# Patient Record
Sex: Female | Born: 1990 | Hispanic: No | Marital: Single | Smoking: Former smoker
Health system: Southern US, Community
[De-identification: ages and names within clinical notes are randomized; demographics above are authoritative.]

## PROBLEM LIST (undated history)

## (undated) ENCOUNTER — Inpatient Hospital Stay (HOSPITAL_COMMUNITY): Payer: Self-pay

## (undated) DIAGNOSIS — R519 Headache, unspecified: Secondary | ICD-10-CM

## (undated) DIAGNOSIS — F32A Depression, unspecified: Secondary | ICD-10-CM

## (undated) DIAGNOSIS — R51 Headache: Secondary | ICD-10-CM

## (undated) DIAGNOSIS — N920 Excessive and frequent menstruation with regular cycle: Secondary | ICD-10-CM

## (undated) DIAGNOSIS — F419 Anxiety disorder, unspecified: Secondary | ICD-10-CM

## (undated) DIAGNOSIS — N61 Mastitis without abscess: Secondary | ICD-10-CM

## (undated) DIAGNOSIS — F329 Major depressive disorder, single episode, unspecified: Secondary | ICD-10-CM

## (undated) DIAGNOSIS — Z8619 Personal history of other infectious and parasitic diseases: Secondary | ICD-10-CM

## (undated) DIAGNOSIS — Z91018 Allergy to other foods: Secondary | ICD-10-CM

## (undated) HISTORY — DX: Allergy to other foods: Z91.018

## (undated) HISTORY — DX: Anxiety disorder, unspecified: F41.9

## (undated) HISTORY — DX: Mastitis without abscess: N61.0

## (undated) HISTORY — DX: Depression, unspecified: F32.A

## (undated) HISTORY — DX: Personal history of other infectious and parasitic diseases: Z86.19

## (undated) HISTORY — DX: Major depressive disorder, single episode, unspecified: F32.9

---
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
## 2010-09-28 NOTE — L&D Delivery Note (Signed)
Delivery Note At 3:03 PM a viable female was delivered via Vaginal, Spontaneous Delivery ROA, easy delivery of the head, easy delivery of the shoulders, baby placed on pt abd, cord doubly clamped et cut by FOB.  APGAR: 8, 8; weight 6 lb 1.5 oz (2764 g).   Placenta schultze, Intact, Spontaneous.  Cord: 3 vessels.IV Pitocin 20 u.  Shallow 1 degree perineal laceration with repair 4-0 Vicryl. Epidural for labor and delivery. EBL 200 ml. Stable. Delivery by April Merritt, CNM  Mom to postpartum.  Baby to skin to skin.  Nigel Bridgeman 09/11/2011, 3:29 PM

## 2011-04-06 LAB — ABO/RH: RH Type: POSITIVE

## 2011-04-06 LAB — ANTIBODY SCREEN: Antibody Screen: NEGATIVE

## 2011-04-06 LAB — HEPATITIS B SURFACE ANTIGEN: Hepatitis B Surface Ag: NEGATIVE

## 2011-04-06 LAB — HIV ANTIBODY (ROUTINE TESTING W REFLEX): HIV: NONREACTIVE

## 2011-04-06 LAB — RUBELLA ANTIBODY, IGM: Rubella: IMMUNE

## 2011-09-04 ENCOUNTER — Encounter (HOSPITAL_COMMUNITY): Payer: Self-pay

## 2011-09-04 ENCOUNTER — Inpatient Hospital Stay (HOSPITAL_COMMUNITY)
Admission: AD | Admit: 2011-09-04 | Discharge: 2011-09-04 | Disposition: A | Discharge status: Home / Self Care | Payer: Medicaid Other | Source: Ambulatory Visit | Attending: Obstetrics and Gynecology | Admitting: Obstetrics and Gynecology

## 2011-09-04 DIAGNOSIS — O368390 Maternal care for abnormalities of the fetal heart rate or rhythm, unspecified trimester, not applicable or unspecified: Secondary | ICD-10-CM | POA: Insufficient documentation

## 2011-09-04 DIAGNOSIS — O4700 False labor before 37 completed weeks of gestation, unspecified trimester: Secondary | ICD-10-CM | POA: Insufficient documentation

## 2011-09-04 DIAGNOSIS — O479 False labor, unspecified: Secondary | ICD-10-CM

## 2011-09-04 HISTORY — DX: Headache: R51

## 2011-09-04 NOTE — Progress Notes (Signed)
Patient states she was sent from the office after having a nonreactive NST in the office. Patient reports some contractions and the baby is moving well. Denies any leaking or bleeding.

## 2011-09-04 NOTE — ED Provider Notes (Signed)
History   20 yo G1P0 at 51 5/7 weeks presenting from office for monitoring due to non-reactive FHR in office while being monitored for contractions.  Denies leaking or bleeding, reports +FM now.  Did note contractions on NST, some moderate, most mild.  Pregnancy remarkable for: Migraines LVEIF Hx FHR arrythmia, with normal echo, and resolution of arrythmia by 34 weeks   Chief Complaint  Patient presents with  . Non-stress Test     OB History    Grav Para Term Preterm Abortions TAB SAB Ect Mult Living   1 0 0 0 0 0 0 0 0 0       Past Medical History  Diagnosis Date  . Headache     hx of migraines    Past Surgical History  Procedure Date  . No past surgeries     Family History  Problem Relation Age of Onset  . Alcohol abuse Father   . Asthma Father   . Heart disease Father   . Cancer Maternal Grandfather     History  Substance Use Topics  . Smoking status: Never Smoker   . Smokeless tobacco: Not on file  . Alcohol Use: No    Allergies: Allergies not on file  No prescriptions prior to admission     Physical Exam   Blood pressure 128/74, pulse 97, temperature 97.8 F (36.6 C), temperature source Oral, resp. rate 20, height 5\' 2"  (1.575 m), weight 76.204 kg (168 lb), SpO2 99.00%.  Chest clear Heart RRR without murmur Abd gravid, NT FHR reactive, no decels, segment of negative spontaneous CST. Irregular UCs in timing and quality, q3-14min, most very mild. Cervix closed at office.  ED Course  IUP at 35 5/7 weeks Prodromal labor/uterine activity Reactive FHR  Plan: D/C home, with discussion of labor s/s. Follow-up as scheduled at office as scheduled, or prn.  Nigel Bridgeman, CNM, [place] 09/04/11

## 2011-09-11 ENCOUNTER — Encounter (HOSPITAL_COMMUNITY): Payer: Self-pay | Admitting: Anesthesiology

## 2011-09-11 ENCOUNTER — Inpatient Hospital Stay (HOSPITAL_COMMUNITY)
Admission: AD | Admit: 2011-09-11 | Discharge: 2011-09-13 | DRG: 775 | Disposition: A | Discharge status: Home / Self Care | Payer: Medicaid Other | Source: Ambulatory Visit | Attending: Obstetrics and Gynecology | Admitting: Obstetrics and Gynecology

## 2011-09-11 ENCOUNTER — Inpatient Hospital Stay (HOSPITAL_COMMUNITY): Payer: Medicaid Other | Admitting: Anesthesiology

## 2011-09-11 ENCOUNTER — Encounter (HOSPITAL_COMMUNITY): Payer: Self-pay | Admitting: *Deleted

## 2011-09-11 LAB — CBC
HCT: 37.6 % (ref 36.0–46.0)
MCH: 26.4 pg (ref 26.0–34.0)
MCHC: 33 g/dL (ref 30.0–36.0)
MCV: 80 fL (ref 78.0–100.0)
Platelets: 156 10*3/uL (ref 150–400)
RDW: 14 % (ref 11.5–15.5)

## 2011-09-11 MED ORDER — BENZOCAINE-MENTHOL 20-0.5 % EX AERO: 1.0000 "application " | INHALATION_SPRAY | CUTANEOUS | Status: DC | PRN

## 2011-09-11 MED ORDER — ZOLPIDEM TARTRATE 5 MG PO TABS: 5.0000 mg | ORAL_TABLET | Freq: Every evening | ORAL | Status: DC | PRN

## 2011-09-11 MED ORDER — LACTATED RINGERS IV SOLN: 500.0000 mL | INTRAVENOUS | Status: DC | PRN

## 2011-09-11 MED ORDER — ONDANSETRON HCL 4 MG PO TABS: 4.0000 mg | ORAL_TABLET | ORAL | Status: DC | PRN

## 2011-09-11 MED ORDER — IBUPROFEN 600 MG PO TABS: 600.0000 mg | ORAL_TABLET | Freq: Four times a day (QID) | ORAL | Status: DC | PRN

## 2011-09-11 MED ORDER — SENNOSIDES-DOCUSATE SODIUM 8.6-50 MG PO TABS
2.0000 | ORAL_TABLET | Freq: Every day | ORAL | Status: DC
  Administered 2011-09-11 – 2011-09-12 (×2): 2 via ORAL

## 2011-09-11 MED ORDER — DIBUCAINE 1 % RE OINT: 1.0000 "application " | TOPICAL_OINTMENT | RECTAL | Status: DC | PRN

## 2011-09-11 MED ORDER — OXYCODONE-ACETAMINOPHEN 5-325 MG PO TABS: 1.0000 | ORAL_TABLET | ORAL | Status: DC | PRN

## 2011-09-11 MED ORDER — CITRIC ACID-SODIUM CITRATE 334-500 MG/5ML PO SOLN: 30.0000 mL | ORAL | Status: DC | PRN

## 2011-09-11 MED ORDER — OXYCODONE-ACETAMINOPHEN 5-325 MG PO TABS
1.0000 | ORAL_TABLET | ORAL | Status: DC | PRN
  Administered 2011-09-12 (×3): 1 via ORAL
  Filled 2011-09-11 (×3): qty 1

## 2011-09-11 MED ORDER — LANOLIN HYDROUS EX OINT: TOPICAL_OINTMENT | CUTANEOUS | Status: DC | PRN

## 2011-09-11 MED ORDER — PRENATAL PLUS 27-1 MG PO TABS: 1.0000 | ORAL_TABLET | Freq: Every day | ORAL | Status: DC

## 2011-09-11 MED ORDER — LACTATED RINGERS IV SOLN: INTRAVENOUS | Status: DC

## 2011-09-11 MED ORDER — OXYCODONE-ACETAMINOPHEN 5-325 MG PO TABS: 2.0000 | ORAL_TABLET | ORAL | Status: DC | PRN

## 2011-09-11 MED ORDER — MEASLES, MUMPS & RUBELLA VAC ~~LOC~~ INJ: 0.5000 mL | INJECTION | Freq: Once | SUBCUTANEOUS | Status: DC

## 2011-09-11 MED ORDER — ACETAMINOPHEN 325 MG PO TABS: 650.0000 mg | ORAL_TABLET | ORAL | Status: DC | PRN

## 2011-09-11 MED ORDER — TETANUS-DIPHTH-ACELL PERTUSSIS 5-2.5-18.5 LF-MCG/0.5 IM SUSP: 0.5000 mL | Freq: Once | INTRAMUSCULAR | Status: DC

## 2011-09-11 MED ORDER — ONDANSETRON HCL 4 MG/2ML IJ SOLN: 4.0000 mg | INTRAMUSCULAR | Status: DC | PRN

## 2011-09-11 MED ORDER — SENNOSIDES-DOCUSATE SODIUM 8.6-50 MG PO TABS: 2.0000 | ORAL_TABLET | Freq: Every day | ORAL | Status: DC

## 2011-09-11 MED ORDER — TETANUS-DIPHTH-ACELL PERTUSSIS 5-2.5-18.5 LF-MCG/0.5 IM SUSP
0.5000 mL | Freq: Once | INTRAMUSCULAR | Status: AC
  Administered 2011-09-12: 0.5 mL via INTRAMUSCULAR
  Filled 2011-09-11: qty 0.5

## 2011-09-11 MED ORDER — EPHEDRINE 5 MG/ML INJ: 10.0000 mg | INTRAVENOUS | Status: DC | PRN

## 2011-09-11 MED ORDER — MAGNESIUM HYDROXIDE 400 MG/5ML PO SUSP: 30.0000 mL | ORAL | Status: DC | PRN

## 2011-09-11 MED ORDER — PHENYLEPHRINE 40 MCG/ML (10ML) SYRINGE FOR IV PUSH (FOR BLOOD PRESSURE SUPPORT)
80.0000 ug | PREFILLED_SYRINGE | INTRAVENOUS | Status: DC | PRN
  Filled 2011-09-11: qty 5

## 2011-09-11 MED ORDER — PRENATAL PLUS 27-1 MG PO TABS
1.0000 | ORAL_TABLET | Freq: Every day | ORAL | Status: DC
  Administered 2011-09-11 – 2011-09-13 (×3): 1 via ORAL
  Filled 2011-09-11 (×3): qty 1

## 2011-09-11 MED ORDER — LACTATED RINGERS IV SOLN
500.0000 mL | Freq: Once | INTRAVENOUS | Status: AC
  Administered 2011-09-11: 500 mL via INTRAVENOUS

## 2011-09-11 MED ORDER — WITCH HAZEL-GLYCERIN EX PADS: 1.0000 "application " | MEDICATED_PAD | CUTANEOUS | Status: DC | PRN

## 2011-09-11 MED ORDER — SIMETHICONE 80 MG PO CHEW: 80.0000 mg | CHEWABLE_TABLET | ORAL | Status: DC | PRN

## 2011-09-11 MED ORDER — BENZOCAINE-MENTHOL 20-0.5 % EX AERO
1.0000 "application " | INHALATION_SPRAY | CUTANEOUS | Status: DC | PRN
  Administered 2011-09-13: 1 via TOPICAL

## 2011-09-11 MED ORDER — FENTANYL 2.5 MCG/ML BUPIVACAINE 1/10 % EPIDURAL INFUSION (WH - ANES)
14.0000 mL/h | INTRAMUSCULAR | Status: DC
  Filled 2011-09-11: qty 60

## 2011-09-11 MED ORDER — FENTANYL 2.5 MCG/ML BUPIVACAINE 1/10 % EPIDURAL INFUSION (WH - ANES)
INTRAMUSCULAR | Status: DC | PRN
  Administered 2011-09-11: 14 mL/h via EPIDURAL

## 2011-09-11 MED ORDER — FLEET ENEMA 7-19 GM/118ML RE ENEM: 1.0000 | ENEMA | RECTAL | Status: DC | PRN

## 2011-09-11 MED ORDER — OXYTOCIN 20 UNITS IN LACTATED RINGERS INFUSION - SIMPLE
125.0000 mL/h | Freq: Once | INTRAVENOUS | Status: DC
Start: 2011-09-11 — End: 2011-09-11

## 2011-09-11 MED ORDER — ONDANSETRON HCL 4 MG/2ML IJ SOLN: 4.0000 mg | Freq: Four times a day (QID) | INTRAMUSCULAR | Status: DC | PRN

## 2011-09-11 MED ORDER — DIPHENHYDRAMINE HCL 25 MG PO CAPS
25.0000 mg | ORAL_CAPSULE | Freq: Four times a day (QID) | ORAL | Status: DC | PRN
  Administered 2011-09-11: 25 mg via ORAL
  Filled 2011-09-11: qty 1

## 2011-09-11 MED ORDER — DIPHENHYDRAMINE HCL 50 MG/ML IJ SOLN: 12.5000 mg | INTRAMUSCULAR | Status: DC | PRN

## 2011-09-11 MED ORDER — LIDOCAINE HCL (PF) 1 % IJ SOLN
30.0000 mL | INTRAMUSCULAR | Status: DC | PRN
  Filled 2011-09-11: qty 30

## 2011-09-11 MED ORDER — DIPHENHYDRAMINE HCL 25 MG PO CAPS: 25.0000 mg | ORAL_CAPSULE | Freq: Four times a day (QID) | ORAL | Status: DC | PRN

## 2011-09-11 MED ORDER — SIMETHICONE 80 MG PO CHEW
80.0000 mg | CHEWABLE_TABLET | ORAL | Status: DC | PRN
Start: 2011-09-11 — End: 2011-09-11

## 2011-09-11 MED ORDER — OXYTOCIN BOLUS FROM INFUSION
500.0000 mL | Freq: Once | INTRAVENOUS | Status: DC
  Filled 2011-09-11: qty 1000
  Filled 2011-09-11: qty 500

## 2011-09-11 MED ORDER — IBUPROFEN 600 MG PO TABS
600.0000 mg | ORAL_TABLET | Freq: Four times a day (QID) | ORAL | Status: DC
  Administered 2011-09-11 – 2011-09-13 (×8): 600 mg via ORAL
  Filled 2011-09-11 (×8): qty 1

## 2011-09-11 MED ORDER — LIDOCAINE HCL 1.5 % IJ SOLN
INTRAMUSCULAR | Status: DC | PRN
  Administered 2011-09-11: 4 mL via EPIDURAL
  Administered 2011-09-11: 4 mL

## 2011-09-11 MED ORDER — PROMETHAZINE HCL 25 MG/ML IJ SOLN
12.5000 mg | Freq: Once | INTRAMUSCULAR | Status: AC
  Administered 2011-09-11: 12.5 mg via INTRAVENOUS
  Filled 2011-09-11: qty 1

## 2011-09-11 MED ORDER — IBUPROFEN 600 MG PO TABS: 600.0000 mg | ORAL_TABLET | Freq: Four times a day (QID) | ORAL | Status: DC

## 2011-09-11 MED ORDER — EPHEDRINE 5 MG/ML INJ
10.0000 mg | INTRAVENOUS | Status: DC | PRN
  Filled 2011-09-11: qty 4

## 2011-09-11 MED ORDER — BUTORPHANOL TARTRATE 2 MG/ML IJ SOLN
1.0000 mg | INTRAMUSCULAR | Status: DC | PRN
  Administered 2011-09-11: 1 mg via INTRAVENOUS
  Filled 2011-09-11: qty 1

## 2011-09-11 MED ORDER — PHENYLEPHRINE 40 MCG/ML (10ML) SYRINGE FOR IV PUSH (FOR BLOOD PRESSURE SUPPORT): 80.0000 ug | PREFILLED_SYRINGE | INTRAVENOUS | Status: DC | PRN

## 2011-09-11 NOTE — Anesthesia Procedure Notes (Signed)
Epidural Patient location during procedure: OB Start time: 09/11/2011 12:15 PM  Staffing Anesthesiologist: Keontae Levingston A. Performed by: anesthesiologist   Preanesthetic Checklist Completed: patient identified, site marked, surgical consent, pre-op evaluation, timeout performed, IV checked, risks and benefits discussed and monitors and equipment checked  Epidural Patient position: sitting Prep: site prepped and draped and DuraPrep Patient monitoring: continuous pulse ox and blood pressure Approach: midline Injection technique: LOR air  Needle:  Needle type: Tuohy  Needle gauge: 17 G Needle length: 9 cm Needle insertion depth: 6 cm Catheter type: closed end flexible Catheter size: 19 Gauge Catheter at skin depth: 11 cm Test dose: negative and 1.5% lidocaine  Assessment Events: blood not aspirated, injection not painful, no injection resistance, negative IV test and no paresthesia  Additional Notes Patient is more comfortable after epidural dosed. Please see RN's note for documentation of vital signs and FHR which are stable.

## 2011-09-11 NOTE — Anesthesia Preprocedure Evaluation (Signed)
Anesthesia Evaluation  Patient identified by MRN, date of birth, ID band Patient awake    Reviewed: Allergy & Precautions, H&P , Patient's Chart, lab work & pertinent test results  Airway Mallampati: III TM Distance: >3 FB Neck ROM: full    Dental No notable dental hx. (+) Teeth Intact   Pulmonary neg pulmonary ROS,  clear to auscultation  Pulmonary exam normal       Cardiovascular neg cardio ROS regular Normal    Neuro/Psych  Headaches, Negative Neurological ROS  Negative Psych ROS   GI/Hepatic negative GI ROS, Neg liver ROS,   Endo/Other  Negative Endocrine ROS  Renal/GU negative Renal ROS  Genitourinary negative   Musculoskeletal   Abdominal Normal abdominal exam  (+)   Peds  Hematology negative hematology ROS (+)   Anesthesia Other Findings   Reproductive/Obstetrics (+) Pregnancy                           Anesthesia Physical Anesthesia Plan  ASA: II  Anesthesia Plan: Epidural   Post-op Pain Management:    Induction:   Airway Management Planned:   Additional Equipment:   Intra-op Plan:   Post-operative Plan:   Informed Consent: I have reviewed the patients History and Physical, chart, labs and discussed the procedure including the risks, benefits and alternatives for the proposed anesthesia with the patient or authorized representative who has indicated his/her understanding and acceptance.     Plan Discussed with: Anesthesiologist and Surgeon  Anesthesia Plan Comments:         Anesthesia Quick Evaluation

## 2011-09-11 NOTE — H&P (Signed)
April Merritt is a 20 y.o. female presenting for SROM at 4, c/o of contractions, denies vag bleeding. G1P0  EDC 10/04/11, 36 5/7 week IUP GBS neg. PG significant for hx fetal arrhythmia resolved, neg fetal echo                              Neg GBS Entered care at  14 4/7weeks, [facility] dating by certain LMP and Anatomy US agrees, US anatomy WNL, 29 week fetal arrhythmia resolved, neg fetal echo                         Maternal Medical History:  Reason for admission: Reason for admission: rupture of membranes and nausea.  Contractions: Onset was 1-2 hours ago.   Frequency: regular.   Perceived severity is mild.    Fetal activity: Perceived fetal activity is normal.   Last perceived fetal movement was within the past hour.    Prenatal complications: no prenatal complications Prenatal Complications - Diabetes: none.    OB History    Grav Para Term Preterm Abortions TAB SAB Ect Mult Living   1 0 0 0 0 0 0 0 0 0      Past Medical History  Diagnosis Date  . Headache     hx of migraines   Past Surgical History  Procedure Date  . Wisdom tooth extraction 2011   Family History: family history includes Alcohol abuse in her father; Asthma in her father; Cancer in her maternal grandfather; and Heart disease in her father.  There is no history of Anesthesia problems. Social History:  reports that she has never smoked. She has never used smokeless tobacco. She reports that she does not drink alcohol or use illicit drugs.  Review of Systems  Constitutional: Negative.   Respiratory: Negative.   Cardiovascular: Negative.   Gastrointestinal: Positive for nausea and vomiting (x 1 on admission).  Genitourinary: Negative.   Musculoskeletal: Negative.   Skin: Negative.   Neurological: Negative.   Endo/Heme/Allergies: Negative.   Psychiatric/Behavioral: Negative.     Dilation: 3 Effacement (%): 100 Station: 0 Exam by:: Lavera Guise, CNM Blood pressure 135/90, pulse 83, temperature 98.1  F (36.7 C), temperature source Oral, resp. rate 20. Fhts 145 LTV min to mod Maternal Exam:  Uterine Assessment: Contraction strength is mild.  Contraction frequency is regular.   Abdomen: Patient reports no abdominal tenderness. Fundal height is 36.   Estimated fetal weight is 6.   Fetal presentation: vertex  Introitus: Normal vulva. Vagina is positive for vaginal discharge (sterile speculum exam pooling clear fluid, vtx visible).  Ferning test: not done.  Nitrazine test: not done. Amniotic fluid character: clear.  Pelvis: adequate for delivery.   Cervix: Cervix evaluated by sterile speculum exam.     Physical Exam  Genitourinary: Vaginal discharge (sterile speculum exam pooling clear fluid, vtx visible) found.    Prenatal labs: ABO, Rh: B/Positive/-- (07/09 0000) Antibody: Negative (07/09 0000) Rubella: Immune (07/09 0000) RPR: Nonreactive (07/09 0000)  HBsAg: Negative (07/09 0000)  HIV: Non-reactive (07/09 0000)  GBS:   negative, GC neg, CHL neg  Assessment/Plan: 36 5/7 week IUP early labor GBS neg Routine admission, continuous EFM, plans IV meds, collaboration with Dr. Stefano Merritt per telephone. Discussed plan with pt and significant other for waterbirth does not have tub, may labor in tub but no waterbirth due to prematurity.   April Merritt 09/11/2011, 9:45 AM

## 2011-09-11 NOTE — Progress Notes (Signed)
Pt from desk to bathroom then to rm, leaking, fluid is still coming. Pants are wet.

## 2011-09-11 NOTE — Progress Notes (Signed)
  Subjective: feeling some pressure comfortable with epidural   Objective: BP 99/46  Pulse 79  Temp(Src) 98.9 F (37.2 C) (Oral)  Resp 18  Ht 5\' 2"  (1.575 m)  Wt 76.204 kg (168 lb)  BMI 30.73 kg/m2  SpO2 99%      FHT:  Category 1 UC:   q 2-3 SVE:   Dilation: 10 Effacement (%): 100 Station: +2 Exam by:: Chiquita Loth, CNM/CCOB  Labs: Lab Results  Component Value Date   WBC 13.0* 09/11/2011   HGB 12.4 09/11/2011   HCT 37.6 09/11/2011   MCV 80.0 09/11/2011   PLT 156 09/11/2011    Assessment / Plan: 2nd stage Plan to labor down.  Nigel Bridgeman 09/11/2011, 1:29 PM

## 2011-09-12 LAB — CBC
HCT: 35.3 % — ABNORMAL LOW (ref 36.0–46.0)
Hemoglobin: 11.4 g/dL — ABNORMAL LOW (ref 12.0–15.0)
MCH: 26.2 pg (ref 26.0–34.0)
MCHC: 32.3 g/dL (ref 30.0–36.0)
MCV: 81.1 fL (ref 78.0–100.0)
RDW: 14.3 % (ref 11.5–15.5)

## 2011-09-12 NOTE — Anesthesia Postprocedure Evaluation (Signed)
  Anesthesia Post-op Note  Patient: April Merritt  Procedure(s) Performed: * No procedures listed *  Patient Location: PACU and Mother/Baby  Anesthesia Type: Epidural  Level of Consciousness: awake, alert  and oriented  Airway and Oxygen Therapy: Patient Spontanous Breathing  Post-op Pain: none  Post-op Assessment: Post-op Vital signs reviewed and Patient's Cardiovascular Status Stable  Post-op Vital Signs: Reviewed and stable  Complications: No apparent anesthesia complications 

## 2011-09-12 NOTE — Anesthesia Postprocedure Evaluation (Signed)
  Anesthesia Post-op Note  Patient: April Merritt  Procedure(s) Performed: * No procedures listed *  Patient Location: PACU and Mother/Baby  Anesthesia Type: Epidural  Level of Consciousness: awake, alert  and oriented  Airway and Oxygen Therapy: Patient Spontanous Breathing  Post-op Pain: none  Post-op Assessment: Post-op Vital signs reviewed and Patient's Cardiovascular Status Stable  Post-op Vital Signs: Reviewed and stable  Complications: No apparent anesthesia complications

## 2011-09-12 NOTE — Progress Notes (Signed)
Post Partum Day 1 Subjective: Reports feeling well.  Ambulating, voiding and tol po liquids and solids without difficulty.  Pos flatus, neg BM.  Denies weakness or dizziness.  Working on breastfeeding.  Infant is sleepy.    Objective: Blood pressure 112/62, pulse 82, temperature 99 F (37.2 C), temperature source Oral, resp. rate 20, height 5\' 2"  (1.575 m), weight 76.204 kg (168 lb), SpO2 99.00%, unknown if currently breastfeeding.Reports mild perineal pain well controlled with medications. Filed Vitals:   09/11/11 1700 09/11/11 1800 09/11/11 2130 09/12/11 0530  BP: 117/71 117/73 107/58 112/62  Pulse: 86 86 82 82  Temp: 99.1 F (37.3 C) 98.9 F (37.2 C) 99.2 F (37.3 C) 99 F (37.2 C)  TempSrc: Oral Oral Oral Oral  Resp: 18 18 20 20   Height:      Weight:      SpO2: 99% 99%     Physical Exam:  General: alert, cooperative and no distress. Heart:  RRR Lungs:  CTA bilat Abd:  Soft, NT, with pos BS x 4 quads Lochia: appropriate, sm rubra Uterine Fundus: firm, NT, 1 below umb. Incision: Perineal repair intact DVT Evaluation: Neg Homan's sign bilat. No evidence of DVT on physical exam. No significant lower extrem edema.    Basename 09/12/11 0525 09/11/11 1130  HGB 11.4* 12.4  HCT 35.3* 37.6    Assessment/Plan: Stable s/p vag del at 36w 5d  Anticipate discharge tomorrow. Continue current care.   LOS: 1 day   Maycol Hoying O. 09/12/2011, 11:01 AM

## 2011-09-13 MED ORDER — BENZOCAINE-MENTHOL 20-0.5 % EX AERO
INHALATION_SPRAY | CUTANEOUS | Status: AC
  Filled 2011-09-13: qty 56

## 2011-09-13 MED ORDER — IBUPROFEN 600 MG PO TABS: 600.0000 mg | ORAL_TABLET | Freq: Four times a day (QID) | ORAL | Status: AC

## 2011-09-13 NOTE — Progress Notes (Signed)
Patient ID: April Merritt, female   DOB: March 24, 1991, 20 y.o.   MRN: [id number] Post Partum Day 2 Subjective: no complaints, up ad lib without syncope, voiding, tolerating PO, + flatus  Pain well controlled with po meds BF well Mood stable, bonding well   Objective: Blood pressure 117/80, pulse 76, temperature 98.2 F (36.8 C), temperature source Oral, resp. rate 18, height 5\' 2"  (1.575 m), weight 76.204 kg (168 lb), SpO2 98.00%, unknown if currently breastfeeding.  Physical Exam:  General: alert Lungs: CTAB Heart: RRR Breasts: filling, nipples intact Lochia: appropriate Uterine Fundus: firm Perineum: wnl DVT Evaluation: No evidence of DVT seen on physical exam. Negative Homan's sign. No significant calf/ankle edema.   Basename 09/12/11 0525 09/11/11 1130  HGB 11.4* 12.4  HCT 35.3* 37.6    Assessment/Plan: Discharge home, Breastfeeding, Lactation consult and Contraception undecided, discussed various methods including r/b/s      LOS: 2 days   April Merritt M 09/13/2011, 7:59 AM

## 2011-09-13 NOTE — Discharge Summary (Signed)
   Obstetric Discharge Summary Reason for Admission: onset of labor, SROM Prenatal Procedures: ultrasound Intrapartum Procedures: spontaneous vaginal delivery Postpartum Procedures: none Complications-Operative and Postpartum: none  Temp:  [97.7 F (36.5 C)-98.5 F (36.9 C)] 98.2 F (36.8 C) (12/16 0628) Pulse Rate:  [76-86] 76  (12/16 0628) Resp:  [18-19] 18  (12/16 0628) BP: (103-117)/(57-80) 117/80 mmHg (12/16 0628) SpO2:  [97 %-98 %] 98 % (12/16 0628) Hemoglobin  Date Value Range Status  09/12/2011 11.4* 12.0-15.0 (g/dL) Final     HCT  Date Value Range Status  09/12/2011 35.3* 36.0-46.0 (%) Final    Hospital Course:  Hospital Course: Admitted in labor with SROM. Neg GBS. Progressed to fully dilated, after receiving epidural. Delivery was performed by M.Kresbach, CNM without difficulty. Patient and baby tolerated the procedure without difficulty, with a 1st laceration noted. Infant to FTN. Mother and infant then had an uncomplicated postpartum course, with breast feeding going well. Mom's physical exam was WNL, and she was discharged home in stable condition. Contraception plan was undecided.  She received adequate benefit from po pain medications.  Discharge Diagnoses: Term Pregnancy-delivered  Discharge Information: Date: 09/13/2011 Activity: pelvic rest Diet: routine Medications:  Medication List  As of 09/13/2011 11:31 AM   START taking these medications         ibuprofen 600 MG tablet   Commonly known as: ADVIL,MOTRIN   Take 1 tablet (600 mg total) by mouth every 6 (six) hours.         CONTINUE taking these medications         fish oil-omega-3 fatty acids 1000 MG capsule      prenatal vitamin w/FE, FA 27-1 MG Tabs          Where to get your medications    These are the prescriptions that you need to pick up.   You may get these medications from any pharmacy.         ibuprofen 600 MG tablet           Condition: stable Instructions: refer to  practice specific booklet Discharge to: home Follow-up Information    Follow up with Demya Scruggs M, CNM in 5 weeks.   Contact information:   [address] [place] [address] [phone]          Newborn Data: Live born  Information for the patient's newborn:  Keelynn, [place] Girl Karrisa [[id number]]  female ; APGAR 8, 8 ; weight ; 6#2oz  Home with mother.  Orlean Holtrop M 09/13/2011, 11:31 AM

## 2011-09-14 NOTE — Progress Notes (Signed)
UR chart review completed.  

## 2011-09-23 ENCOUNTER — Inpatient Hospital Stay (HOSPITAL_COMMUNITY): Admission: RE | Admit: 2011-09-23 | Discharge: 2011-09-23 | Payer: Medicaid Other | Source: Ambulatory Visit

## 2011-09-23 NOTE — Progress Notes (Signed)
Adult Lactation Consultation Outpatient Visit Note  Patient Name: April Merritt Date of Birth: 04-21-1991 Gestational Age at Delivery: Unknown Type of Delivery:   Breastfeeding History: Frequency of Breastfeeding:  Length of Feeding:  Voids:  Stools:   Supplementing / Method: Pumping:  Type of Pump:   Frequency:  Volume:    Comments:    Consultation Evaluation:  Initial Feeding Assessment: Pre-feed Weight: Post-feed Weight: Amount Transferred: Comments:  Additional Feeding Assessment: Pre-feed Weight: Post-feed Weight: Amount Transferred: Comments:  Additional Feeding Assessment: Pre-feed Weight: Post-feed Weight: Amount Transferred: Comments:  Total Breast milk Transferred this Visit:  Total Supplement Given:   Additional Interventions:   Follow-Up      Stevan Born [facility] 09/23/2011, 12:57 PM

## 2011-09-27 ENCOUNTER — Emergency Department (HOSPITAL_COMMUNITY)
Admission: EM | Admit: 2011-09-27 | Discharge: 2011-09-28 | Disposition: A | Discharge status: Home / Self Care | Payer: Medicaid Other | Attending: Emergency Medicine | Admitting: Emergency Medicine

## 2011-09-27 ENCOUNTER — Encounter (HOSPITAL_COMMUNITY): Payer: Self-pay | Admitting: Emergency Medicine

## 2011-09-27 DIAGNOSIS — N61 Mastitis without abscess: Secondary | ICD-10-CM | POA: Insufficient documentation

## 2011-09-27 DIAGNOSIS — R509 Fever, unspecified: Secondary | ICD-10-CM | POA: Insufficient documentation

## 2011-09-27 DIAGNOSIS — R000 Tachycardia, unspecified: Secondary | ICD-10-CM | POA: Insufficient documentation

## 2011-09-27 MED ORDER — CEPHALEXIN 500 MG PO CAPS: 500.0000 mg | ORAL_CAPSULE | Freq: Four times a day (QID) | ORAL | Status: AC

## 2011-09-27 NOTE — ED Provider Notes (Signed)
History     CSN: [id number]  Arrival date & time 09/27/11  2057   First MD Initiated Contact with Patient 09/27/11 2158      Chief Complaint  Patient presents with  . Mastitis     (Consider location/radiation/quality/duration/timing/severity/associated sxs/prior treatment) The history is provided by the patient and a relative.   the patient is a 20 year old female who presents with complaints of breast pain and fever. She had a vaginal delivery on 09/11/2011 and has been breast-feeding since that time with discomfort since her discharge from the hospital. She has been seen by her OB as well as a Advertising copywriter. Due to breast-feeding difficulties, she has been almost exclusively pumping for the last several days. She has been pumping every 3-3-1/2 hours. Yesterday, she noticed a significant decrease in her output and started to have chills. Today she checked her fever and her temperature was 102.7. She was given Tylenol with improvement in her fever and discomfort. She reports she is seen multiple areas of redness to bilateral breasts with tenderness all over. She notes no specific hard lumps to either breast. She has had associated myalgias.  Past Medical History  Diagnosis Date  . Headache     hx of migraines    Past Surgical History  Procedure Date  . Wisdom tooth extraction 2011    Family History  Problem Relation Age of Onset  . Alcohol abuse Father   . Asthma Father   . Heart disease Father   . Cancer Maternal Grandfather   . Anesthesia problems Neg Hx     History  Substance Use Topics  . Smoking status: Never Smoker   . Smokeless tobacco: Never Used  . Alcohol Use: No    OB History    Grav Para Term Preterm Abortions TAB SAB Ect Mult Living   1 1 0 1 0 0 0 0 0 1       Review of Systems 10 systems reviewed and are negative for acute change except as noted in the HPI.  Allergies  Review of patient's allergies indicates no known allergies.  Home  Medications   Current Outpatient Rx  Name Route Sig Dispense Refill  . IBUPROFEN 200 MG PO TABS Oral Take 600 mg by mouth every 6 (six) hours as needed. Pain     . PRENATAL PLUS 27-1 MG PO TABS Oral Take 1 tablet by mouth daily.        BP 108/70  Pulse 115  Temp(Src) 99.5 F (37.5 C) (Oral)  Resp 20  SpO2 98%  Breastfeeding? No  Physical Exam  Nursing note and vitals reviewed. Constitutional: She is oriented to person, place, and time. She appears well-developed and well-nourished. No distress.  HENT:  Head: Normocephalic and atraumatic.  Right Ear: External ear normal.  Left Ear: External ear normal.  Mouth/Throat: Oropharynx is clear and moist.  Eyes: Pupils are equal, round, and reactive to light.  Neck: Normal range of motion. Neck supple.  Cardiovascular: Regular rhythm.   No murmur heard.      Tachycardic  Pulmonary/Chest: Effort normal and breath sounds normal. No respiratory distress. She has no wheezes. She exhibits no tenderness.  Abdominal: Soft. She exhibits no distension. There is no tenderness.  Musculoskeletal: She exhibits no edema and no tenderness.  Lymphadenopathy:    She has no axillary adenopathy.  Neurological: She is alert and oriented to person, place, and time. No cranial nerve deficit.  Skin: Skin is warm and dry.  Bilateral breasts with scattered areas of erythema. There multiple small firm nodules to bilateral breasts, none greater than 1 cm in diameter and most are moderately tender to palpation. There is no erythematous streaking.    ED Course  Procedures (including critical care time)  Labs Reviewed - No data to display No results found.   Dx 1: Mastitis   MDM  Given patient's breast pain, myalgias, and fever of 102.7, she appears to have mastitis. As Keflex is very safe for breast-feeding, we'll start her on this and I recommended that she followup with her OB for a recheck within the next 2 days. She was also advised to get  plenty of rest, use warm compresses, and empty her breasts as much as possible. The patient and family member voiced understanding. I have also recommended that they be reevaluated by a lactation consultant.        [address] [place], [place] 09/27/11 [phone]

## 2011-09-27 NOTE — ED Notes (Addendum)
Pt presented to teh ER with c/o of painful 2/10 and hot to touch, noted minimal redness, skin moderately tight, s/s of mastitis, pt states that she is breastfeeding and pumping, last time breat pumped around 1800. Pt reports that she took temp at home 102.7, pt took Tylenol 2x 500mg  prior arrival

## 2011-09-27 NOTE — ED Provider Notes (Signed)
Medical screening examination/treatment/procedure(s) were performed by non-physician practitioner and as supervising physician I was immediately available for consultation/collaboration. Alson Mcpheeters Y.   Gavin Pound. Oletta Lamas, MD 09/27/11 1191

## 2011-10-01 ENCOUNTER — Ambulatory Visit (HOSPITAL_COMMUNITY)
Admission: RE | Admit: 2011-10-01 | Discharge: 2011-10-01 | Disposition: A | Discharge status: Home / Self Care | Payer: BC Managed Care – HMO | Source: Ambulatory Visit | Attending: Obstetrics and Gynecology | Admitting: Obstetrics and Gynecology

## 2011-10-01 DIAGNOSIS — N61 Mastitis without abscess: Secondary | ICD-10-CM

## 2011-10-01 HISTORY — DX: Mastitis without abscess: N61.0

## 2011-10-01 NOTE — Progress Notes (Signed)
Adult Lactation Consultation Outpatient Visit Note  Patient Name: April Merritt Date of Birth: [birth year] Gestational Age at Delivery: Unknown Type of Delivery: vaginal del on 09/11/11, weight 6-1, 36 5/7 weeks  Breastfeeding History: Frequency of Breastfeeding: x1 yesterday Length of Feeding: only few sucks Voids: 6 Stools: 6, yellow  Supplementing / Method: Pumping:  Type of Pump:pump n style   Frequency: every 2-3 hrs  Volume:  5-6 ounces  Comments: Outpatient visit for assistance with difficult latch. Mother was seen in Emergency Dept on 09/28/11 for Mastitis and is being treated with Keflex and Tylenol. Mother states that her  breast are itching and that she phoned MD and was told to stop medicine until MD return call. Mothers breast are full. Observed several scratch marks under breast where she has been scratching. Mother denies having had fever in several days. She is exclusively pumping but desires to get infant back to breast feeding.    Consultation Evaluation: Mother assisted with latch using SNS with 33ml of Expressed breastmilk. Infant took feeding well. Mother has full breast with good milk flow. Infant transferred total of 62 ml for feeding on first breastin about 20-25 mins. Mother inst in breast compression. Assisted infant to latch on second breast and infant took 12ml. Discussed need for commitment at home to conitnue with plan. inst mother to offer breast with infants cue and every 2-3 hrs. inst mother to continue to pump after feeding.inst mother to use SNS and give infant at least 30 ml . Mother receptive to plan. Discussed scheduling follow up visit to evaluate  more assistance and weight check. Mother declined follow up visit at this time. Reminded mother of option of coming to support group. Mother remains undecided about commitment.  Initial Feeding Assessment: Pre-feed [id number] Post-feed [id number] Amount Transferred:39ml Comments:  Additional Feeding  Assessment: Pre-feed [id number] Post-feed [id number] Amount Transferred:92ml Comments:  Additional Feeding Assessment: Pre-feed Weight: Post-feed Weight: Amount Transferred: Comments:  Total Breast milk Transferred this Visit: 72ml Total Supplement Given:   Additional Interventions:   Follow-Up   Prn   Stevan Born [facility] 10/01/2011, 2:39 PM

## 2011-12-10 ENCOUNTER — Encounter (INDEPENDENT_AMBULATORY_CARE_PROVIDER_SITE_OTHER): Payer: Medicaid Other | Admitting: Obstetrics and Gynecology

## 2011-12-10 DIAGNOSIS — Z30431 Encounter for routine checking of intrauterine contraceptive device: Secondary | ICD-10-CM

## 2012-01-21 ENCOUNTER — Telehealth: Payer: Self-pay | Admitting: Obstetrics and Gynecology

## 2012-01-21 NOTE — Telephone Encounter (Signed)
TC TO PT REGARDING MESSAGE. PT STATES THAT SHE HAS A PARAGARD IUD(INSERTED 10/26/11) AND SHE FEELS LIKE THE IUD HAS MOVED SHE STATES THAT SHE IS HAVING ABDOMINAL PAIN THAT GOES FROM BLADDER TO HER BELLY BUTTON. PER HS OFFERED PT APPT FOR Friday BUT PT STATES THAT SHE COULD NOT COME TO APPT.PT STATES THAT SHE WOULD GO TO HOSP. ADVISED PT THAT IT MAY BE A WAIT AT HOSP.PT VOICED UNDERSTANDING

## 2012-01-21 NOTE — Telephone Encounter (Signed)
Routed to triage 

## 2012-01-22 ENCOUNTER — Telehealth: Payer: Self-pay | Admitting: Obstetrics and Gynecology

## 2012-01-22 NOTE — Telephone Encounter (Signed)
TC to pt per MK.  Pt did not go to [facility].  States she was seen at [facility] who confirmed strings of IUD In place by pelvic exam.  Had slight pain this AM  x5 min and then resolved.   Pt to call if recurs.  After hours instructions given. Pt verbalizes comprehension.  Pt declines appt 01/25/12 due to lack of insurance coverage.

## 2012-01-22 NOTE — Telephone Encounter (Signed)
  Tc to pt.  States is covered by insurance.   Desires appt.  Sched with EP 01/26/12.

## 2012-01-26 ENCOUNTER — Encounter: Payer: Self-pay | Admitting: Obstetrics and Gynecology

## 2012-01-26 ENCOUNTER — Ambulatory Visit (INDEPENDENT_AMBULATORY_CARE_PROVIDER_SITE_OTHER): Payer: BC Managed Care – PPO | Admitting: Obstetrics and Gynecology

## 2012-01-26 VITALS — BP 102/64 | Temp 98.9°F | Ht 61.0 in | Wt 130.0 lb

## 2012-01-26 DIAGNOSIS — R519 Headache, unspecified: Secondary | ICD-10-CM | POA: Insufficient documentation

## 2012-01-26 DIAGNOSIS — R51 Headache: Secondary | ICD-10-CM

## 2012-01-26 DIAGNOSIS — N949 Unspecified condition associated with female genital organs and menstrual cycle: Secondary | ICD-10-CM

## 2012-01-26 DIAGNOSIS — R102 Pelvic and perineal pain: Secondary | ICD-10-CM

## 2012-01-26 LAB — POCT URINALYSIS DIPSTICK
Blood, UA: NEGATIVE
Ketones, UA: NEGATIVE
Protein, UA: NEGATIVE
Spec Grav, UA: 1.01

## 2012-01-26 LAB — POCT URINE PREGNANCY: Preg Test, Ur: NEGATIVE

## 2012-01-26 NOTE — Progress Notes (Signed)
Patient with Paragard insertion 10/26/2011 and breastfeeding presents for follow up and abdominal pain off & on.  Described as sharp/dull/crampy, lasting 5-30 minutes, may be worse with eating or certain movements. Denies uti sx, change in bowel movements. dyspareunia, vomiting though gets nauseous with the pain. Pain rated (at its worse) as 7/10 but doesn't take anything for it.  O:  Wet Prep: pH 5.0, whiff-neg, no clue, yeast, or trich       UPT- negative       U/A-negative  Abdomen: soft with mild tenderness without guarding in right lower quadrant, no rebound or organomegaly  Pelvic:  EGBUS-wnl, vagina-rugous, cervix-strings visible, uterus-NSSC, non-tender, adnexae-no tenderness or masses  A: IUD Check     Abdominal Pain  P: Pelvic Ultrasound for pelvic pain     Reviewed causes of pelvic pain

## 2012-01-26 NOTE — Progress Notes (Signed)
Vag. Discharge:yes Odor:no Fever:no Irreg.Periods:no  PT IS BF Dyspareunia:no Dysuria:no Frequency:no Urgency:no Hematuria:no Kidney stones:no Constipation:no Diarrhea:no Rectal Bleeding: no Vomiting:no Nausea:yes Pregnant:no Fibroids:no Endometriosis:no Hx of Ovarian Cyst:no Hx IUD:yes Hx STD-PID:no Appendectomy:no Gall Bladder Dz:no

## 2012-01-28 ENCOUNTER — Encounter: Payer: Self-pay | Admitting: Obstetrics and Gynecology

## 2012-01-28 ENCOUNTER — Ambulatory Visit (INDEPENDENT_AMBULATORY_CARE_PROVIDER_SITE_OTHER): Payer: BC Managed Care – PPO

## 2012-01-28 ENCOUNTER — Ambulatory Visit (INDEPENDENT_AMBULATORY_CARE_PROVIDER_SITE_OTHER): Payer: BC Managed Care – PPO | Admitting: Obstetrics and Gynecology

## 2012-01-28 ENCOUNTER — Other Ambulatory Visit: Payer: Self-pay | Admitting: Obstetrics and Gynecology

## 2012-01-28 VITALS — BP 92/62 | Wt 130.0 lb

## 2012-01-28 DIAGNOSIS — R102 Pelvic and perineal pain: Secondary | ICD-10-CM

## 2012-01-28 DIAGNOSIS — N949 Unspecified condition associated with female genital organs and menstrual cycle: Secondary | ICD-10-CM

## 2012-01-28 NOTE — Progress Notes (Signed)
20 YO with IUD (Mirena) placed 10/26/11 by Dr. Normand Sloop presents for ultrasound follow-up.Patient seen for pelvic pain. on yesterday.   O: U/S wnl except IUD displaced within the myometrium anterior to the endometrial canal on 3 D rendering.   A: Displaced IUD     Pelvic Pain  P:  Patient wants another IUD as other methods are not as appealing to her.  Desires to keep the one she has for now until  insurance coverage is determined.   Chart to Lenetta Quaker to determine coverage for IUD removal and replacement due to displacement and pelvic pain

## 2012-01-29 ENCOUNTER — Encounter: Payer: Self-pay | Admitting: Obstetrics and Gynecology

## 2012-01-29 ENCOUNTER — Ambulatory Visit (INDEPENDENT_AMBULATORY_CARE_PROVIDER_SITE_OTHER): Payer: BC Managed Care – PPO | Admitting: Obstetrics and Gynecology

## 2012-01-29 ENCOUNTER — Telehealth: Payer: Self-pay | Admitting: Obstetrics and Gynecology

## 2012-01-29 VITALS — BP 108/58 | Ht 61.5 in | Wt 130.0 lb

## 2012-01-29 DIAGNOSIS — Z309 Encounter for contraceptive management, unspecified: Secondary | ICD-10-CM

## 2012-01-29 DIAGNOSIS — Z30432 Encounter for removal of intrauterine contraceptive device: Secondary | ICD-10-CM

## 2012-01-29 MED ORDER — CEPHALEXIN 500 MG PO CAPS: 500.0000 mg | ORAL_CAPSULE | Freq: Four times a day (QID) | ORAL | Status: AC

## 2012-01-29 MED ORDER — MEDROXYPROGESTERONE ACETATE 150 MG/ML IM SUSP: 150.0000 mg | Freq: Once | INTRAMUSCULAR | Status: DC

## 2012-01-29 NOTE — Patient Instructions (Signed)
Schedule Depo Provera injection for Monday 02/01/12  Use back up contraception for the first 4 weeks of getting the Depo Provera Take Calcium 500 mg twice daily while on Depo Provera

## 2012-01-29 NOTE — Telephone Encounter (Signed)
pts mother called pt having pain from IUD wants it taken out today consult with EP ok to work in pt today,advised MOM to have pt come now [facility]

## 2012-01-29 NOTE — Progress Notes (Signed)
Patient seen yesterday for follow-up of pelvic ultrasound due to pelvic pain since IUD insertion in January 2013.  Ultrasound showed the IUD to be displaced  within the myometrium anterior to the endometrial canal.  Patient initially wanted to pursue the possibility of getting the IUD removed and replaced with another IUD if insurance coverage would allow however, she returns today to have it removed due to the pain. Patient has decided to use Depo Provera instead.  O: Pelvic: EGBUS-nml, vagina-normal, cervix-strings visible, prepped with Betadine and removed Paragard IUD with only slight resistance.  No bleeding observed. Patient complained of cramping that gradually decreased.  A:  Pelvic Pain       IUD Removal due to displacement      Breastfeeding  P: Reviewed Depo Provera, MOA, effectiveness, dosing, R & B, need to take calcium 1000 mg/d. Emphasized the possibility for irregular bleeding or amenorrhea  Depo Provera 150 mg #1 Bring to office for injection 4 refills Use back up contraception for  4 weeks  Cephalexin 500 mg #30 1 po qid x 7days  RTO-prn

## 2012-02-01 ENCOUNTER — Ambulatory Visit (INDEPENDENT_AMBULATORY_CARE_PROVIDER_SITE_OTHER): Payer: BC Managed Care – PPO | Admitting: Obstetrics and Gynecology

## 2012-02-01 ENCOUNTER — Telehealth: Payer: Self-pay | Admitting: Obstetrics and Gynecology

## 2012-02-01 ENCOUNTER — Other Ambulatory Visit (INDEPENDENT_AMBULATORY_CARE_PROVIDER_SITE_OTHER): Payer: BC Managed Care – PPO

## 2012-02-01 ENCOUNTER — Encounter: Payer: Self-pay | Admitting: Obstetrics and Gynecology

## 2012-02-01 VITALS — BP 110/60 | Resp 16 | Ht 61.0 in | Wt 140.0 lb

## 2012-02-01 DIAGNOSIS — N920 Excessive and frequent menstruation with regular cycle: Secondary | ICD-10-CM | POA: Insufficient documentation

## 2012-02-01 DIAGNOSIS — R109 Unspecified abdominal pain: Secondary | ICD-10-CM | POA: Insufficient documentation

## 2012-02-01 DIAGNOSIS — E663 Overweight: Secondary | ICD-10-CM | POA: Insufficient documentation

## 2012-02-01 DIAGNOSIS — Z3009 Encounter for other general counseling and advice on contraception: Secondary | ICD-10-CM

## 2012-02-01 DIAGNOSIS — R1012 Left upper quadrant pain: Secondary | ICD-10-CM

## 2012-02-01 HISTORY — DX: Excessive and frequent menstruation with regular cycle: N92.0

## 2012-02-01 MED ORDER — MEDROXYPROGESTERONE ACETATE 150 MG/ML IM SUSP
150.0000 mg | Freq: Once | INTRAMUSCULAR | Status: AC
  Administered 2012-02-01: 150 mg via INTRAMUSCULAR

## 2012-02-01 MED ORDER — TRAMADOL HCL 50 MG PO TABS: 50.0000 mg | ORAL_TABLET | Freq: Four times a day (QID) | ORAL | Status: AC | PRN

## 2012-02-01 NOTE — Telephone Encounter (Signed)
April Merritt cld pt and appt was sched

## 2012-02-01 NOTE — Telephone Encounter (Signed)
Triage pool/was seen by AVS today

## 2012-02-01 NOTE — Progress Notes (Signed)
Addended by: Janine Limbo on: 02/01/2012 09:07 PM   Modules accepted: Orders

## 2012-02-01 NOTE — Telephone Encounter (Signed)
PT CALLED, STATES HAD TO HAVE HER PARAGUARD REMOVED LAST WEEK AND SHE IS NOW HAVING SOME HVY BLDG AND ABD CRAMPING.  PT SAYS IS NOT CHANGING A SOAKED PAD QHR, BUT DOES HAVE A GUSH OF BLOOD COME OUT AFTER SHE GETS UP AFTER LYING DOWN, IS NOT GETTING MUCH RELIEF WITH TAKING 600MG  IBUPROFEN, PT ?'S IF CAN BE SEEN TODAY DURING DEPOT PROVERA INJECTION APPT.  PER AVS CAN WORK INTO SCHEDULE, PT TO BE SEEN TODAY @ 1000, PT VOICES UNDERSTANDING.

## 2012-02-01 NOTE — Progress Notes (Signed)
[name] is a 21 y.o. year old female,G1P0101, who presents for a problem visit.  Subjective:  The patient complains of heavy menstrual bleeding.  She also complains of a "sticking "pain.  She had her IUD removed because it was thought to be displaced within the myometrium anteriorly.  She received Depo-Provera today for contraception and to manage her bleeding.  Objective:  BP 110/60  Resp 16  Ht 5\' 1"  (1.549 m)  Wt 140 lb (63.504 kg)  BMI 26.45 kg/m2  Breastfeeding? Yes   General: alert and cooperative Resp: clear to auscultation bilaterally Cardio: regular rate and rhythm, S1, S2 normal, no murmur, click, rub or gallop GI: soft, non-tender; bowel sounds normal; no masses,  no organomegaly  External genitalia: menstrual blood present Vaginal: normal mucosa without prolapse or lesions Cervix: normal appearance Adnexa: normal bimanual exam Uterus: normal size shape and consistency  Assessment:  Irregular menstrual bleeding after having Mirena IUD removed.  Abdominal pain of uncertain etiology.  Plan:  Ultram called to [facility].  I recommended to the patient that she simply observe her pain for now.  Depo-Provera should help decrease her menstrual bleeding.  Return to office prn if symptoms worsen or fail to improve.   Leonard Schwartz M.D.  02/01/2012 9:04 PM

## 2012-02-01 NOTE — Progress Notes (Signed)
Contraception: yes New Medications: no Abdominal Pain: yes  Fibroids: no Menopausal Symptoms: no Increased Stress: no  Hormone Therapy: no Vaginal Discharge: no Other: pt took IBP 600mg  to help with pain and it did not give any relief.

## 2012-02-05 ENCOUNTER — Other Ambulatory Visit: Payer: Self-pay | Admitting: Obstetrics and Gynecology

## 2012-02-05 NOTE — Telephone Encounter (Signed)
PT CALLED REQUESTING A PAIN MED, TC TO PT, PT STATES SHE DID CALL FOR MED BUT REALIZES NOW THAT SHE DOES NOT NEED IT.

## 2012-02-05 NOTE — Telephone Encounter (Signed)
TRIAGE/EPIC °

## 2012-03-01 NOTE — Telephone Encounter (Signed)
Triage/general quest. 

## 2012-03-02 NOTE — Telephone Encounter (Signed)
TC from pt.  States delivered 08/2012.  Is now pumping 3x/day and wants to D/C breast feeding.  Advised to slowly decrease number of times she pumps per day.  Advised no breast stimulation.  May use cabbage leaves to relieve any engorgement. Number given for breast feeding consultant at [facility].  To call with any concerns.  Pt verbalizes comprehension.

## 2012-03-02 NOTE — Telephone Encounter (Signed)
TC to pt. LM to return call regarding message. 

## 2012-03-02 NOTE — Telephone Encounter (Signed)
Returned pt's call. LM to return call.   

## 2012-03-10 ENCOUNTER — Ambulatory Visit (INDEPENDENT_AMBULATORY_CARE_PROVIDER_SITE_OTHER): Payer: BC Managed Care – PPO | Admitting: Obstetrics and Gynecology

## 2012-03-10 ENCOUNTER — Encounter: Payer: Self-pay | Admitting: Obstetrics and Gynecology

## 2012-03-10 VITALS — BP 102/58 | Temp 98.7°F | Ht 61.0 in | Wt 133.0 lb

## 2012-03-10 DIAGNOSIS — Z309 Encounter for contraceptive management, unspecified: Secondary | ICD-10-CM

## 2012-03-10 DIAGNOSIS — R109 Unspecified abdominal pain: Secondary | ICD-10-CM

## 2012-03-10 NOTE — Progress Notes (Signed)
Pt here to f/u from IUD removal. Is now using Depo Provera inject. States that she is having some bleeding that started yesterday.   HISTORY OF PRESENT ILLNESS  [name] is a 21 y.o. year old female,G1P0101, who presents for a problem visit. The patient was having pain associated with an IUD.  The IUD was removed and she was given Depo-Provera.  She reports that her pain has resolved and she is doing much better.  Subjective:  The patient was to continue Depo-Provera for contraception.  Objective:  BP 102/58  Temp 98.7 F (37.1 C) (Oral)  Ht 5\' 1"  (1.549 m)  Wt 133 lb (60.328 kg)  BMI 25.13 kg/m2  LMP 03/09/2012   GI: soft, non-tender; bowel sounds normal; no masses,  no organomegaly  External genitalia: normal general appearance Vaginal: normal without tenderness, induration or masses Cervix: normal appearance Adnexa: normal bimanual exam Uterus: normal size shape and consistency  Assessment:  Improved pain after removing IUD Patient is comfortable with Depo-Provera for contraception  Plan:  Annual exam next visit Continue Depo-Provera 150 mg every 12 weeks.  Risk and benefits reviewed.  Return to office in 2 month(s).   Leonard Schwartz M.D.  03/10/2012 9:29 AM

## 2012-05-10 ENCOUNTER — Encounter: Payer: Self-pay | Admitting: Obstetrics and Gynecology

## 2012-05-10 ENCOUNTER — Ambulatory Visit (INDEPENDENT_AMBULATORY_CARE_PROVIDER_SITE_OTHER): Payer: Medicaid Other | Admitting: Obstetrics and Gynecology

## 2012-05-10 VITALS — BP 102/58 | Ht 61.0 in | Wt 136.0 lb

## 2012-05-10 DIAGNOSIS — IMO0001 Reserved for inherently not codable concepts without codable children: Secondary | ICD-10-CM

## 2012-05-10 DIAGNOSIS — R109 Unspecified abdominal pain: Secondary | ICD-10-CM

## 2012-05-10 DIAGNOSIS — Z01419 Encounter for gynecological examination (general) (routine) without abnormal findings: Secondary | ICD-10-CM

## 2012-05-10 DIAGNOSIS — Z309 Encounter for contraceptive management, unspecified: Secondary | ICD-10-CM

## 2012-05-10 DIAGNOSIS — K5900 Constipation, unspecified: Secondary | ICD-10-CM

## 2012-05-10 DIAGNOSIS — Z0000 Encounter for general adult medical examination without abnormal findings: Secondary | ICD-10-CM

## 2012-05-10 MED ORDER — MEDROXYPROGESTERONE ACETATE 150 MG/ML IM SUSP: 150.0000 mg | Freq: Once | INTRAMUSCULAR | Status: DC

## 2012-05-10 NOTE — Progress Notes (Signed)
Last Pap: never WNL: never Regular Periods:no Contraception: depo  Monthly Breast exam:no Tetanus<75yrs:yes Nl.Bladder Function:yes Daily BMs:yes Healthy Diet:yes Calcium:yes Mammogram:no Date of Mammogram: never Exercise:yes Have often Exercise: walking with baby  Seatbelt: yes Abuse at home: no Stressful work:yes can be at times  Sigmoid-colonoscopy: never had one  PCP: equal Change in PMH: none Change in HQI:ONGE  ANNUAL GYNECOLOGIC EXAMINATION   April Merritt is a 21 y.o. female, G1P0101, who presents for an annual exam. See above. The patient complains of vague mid upper abdominal pain that is intermittent.  She complains of constipation.  She has a long history of the same.  Prior Hysterectomy: No    History   Social History  . Marital Status: Single    Spouse Name: N/A    Number of Children: N/A  . Years of Education: N/A   Social History Main Topics  . Smoking status: Never Smoker   . Smokeless tobacco: Never Used  . Alcohol Use: No  . Drug Use: No  . Sexually Active: Yes -- Female partner(s)    Birth Control/ Protection: Injection     depo   Other Topics Concern  . None   Social History Narrative  . None    Menstrual cycle:   LMP: No LMP recorded. Patient has had an injection.             The following portions of the patient's history were reviewed and updated as appropriate: allergies, current medications, past family history, past medical history, past social history, past surgical history and problem list.  Review of Systems Pertinent items are noted in HPI. Breast:Negative for breast lump,nipple discharge or nipple retraction Gastrointestinal: Negative for abdominal pain, change in bowel habits or rectal bleeding Urinary:negative   Objective:    BP 102/58  Ht 5\' 1"  (1.549 m)  Wt 136 lb (61.689 kg)  BMI 25.70 kg/m2  Breastfeeding? No    Weight:  Wt Readings from Last 1 Encounters:  05/10/12 136 lb (61.689 kg)          BMI: Body  mass index is 25.70 kg/(m^2).  General Appearance: Alert, appropriate appearance for age. No acute distress HEENT: Grossly normal Neck / Thyroid: Supple, no masses, nodes or enlargement Lungs: clear to auscultation bilaterally Back: No CVA tenderness Breast Exam: No masses or nodes.No dimpling, nipple retraction or discharge. Cardiovascular: Regular rate and rhythm. S1, S2, no murmur Gastrointestinal: Soft, non-tender, no masses or organomegaly  ++++++++++++++++++++++++++++++++++++++++++++++++++++++++  Pelvic Exam: External genitalia: normal general appearance Vaginal: normal without tenderness, induration or masses and relaxation: No Cervix: normal appearance Adnexa: normal bimanual exam Uterus: normal size, shape, and consistency Rectovaginal: not indicated  ++++++++++++++++++++++++++++++++++++++++++++++++++++++++  Lymphatic Exam: Non-palpable nodes in neck, clavicular, axillary, or inguinal regions Neurologic: Normal speech, no tremor  Psychiatric: Alert and oriented, appropriate affect.   Wet Prep:   not applicable Urinalysis:  not applicable UPT:           Not done   Assessment:    Normal gyn exam   Overweight or obese: Yes   Pelvic relaxation: No  Abdominal pain  Constipation   Plan:    pap smear return annually or prn Contraception:Depo-Provera    Medications prescribed: Depo-Provera 150 mg every 12 weeks  STD screen request: Yes ; gonorrhea and Chlamydia sent  The updated Pap smear screening guidelines were discussed with the patient. The patient requested that I obtain a Pap smear: Yes.  Kegel exercises discussed: No.  Proper diet and regular exercise  were reviewed.  Annual mammograms recommended starting at age 80. Proper breast care was discussed.  Screening colonoscopy is recommended beginning at age 69.  Regular health maintenance was reviewed.  Sleep hygiene was discussed.  Adequate calcium and vitamin D intake was  emphasized.  Mylinda Latina.D.

## 2012-05-10 NOTE — Addendum Note (Signed)
Addended by: Tim Lair on: 05/10/2012 10:56 AM   Modules accepted: Orders

## 2012-05-12 LAB — PAP IG, CT-NG, RFX HPV ASCU

## 2012-07-21 ENCOUNTER — Telehealth: Payer: Self-pay | Admitting: Obstetrics and Gynecology

## 2012-07-21 NOTE — Telephone Encounter (Signed)
Tc to pt regarding msg.  Pt states after 4 months of not BFing has started to produce milk again.  Breasts have become engorged and sometimes has sharp shooting pains going through them.    States has not been trying to stimulate breasts.  Pt states have tried everything suggested to her to dry up her milk except try cabbage leaves and is continuing to produce milk.  Pt advised to call lactation consultant for suggestions on what to do or offered an appt to be seen in the office.  Pt will call lactation consultant for now for suggestions, will call back if needs an appt and in the mean time will try cabbage leaves as well as what has been suggested in the past.

## 2012-07-25 ENCOUNTER — Telehealth: Payer: Self-pay | Admitting: Obstetrics and Gynecology

## 2012-07-25 NOTE — Telephone Encounter (Signed)
Returned pt's call regarding problems with lactation. Pt stated that she stopped breast feeding 4 months ago. Pt has had problems trying to dry up milk. Pt has used cabbage leaves, tightened bra and avoiding stimulation. Pt also states that she is on Depo-Provera and has symptoms of being pregnant. Pt was given an ov for tomorrow @ 9:30 with Dr. Su Hilt. Mathis Bud

## 2012-07-26 ENCOUNTER — Encounter: Payer: Self-pay | Admitting: Obstetrics and Gynecology

## 2012-07-26 ENCOUNTER — Ambulatory Visit (INDEPENDENT_AMBULATORY_CARE_PROVIDER_SITE_OTHER): Payer: Medicaid Other | Admitting: Obstetrics and Gynecology

## 2012-07-26 VITALS — BP 104/64 | Ht 61.0 in | Wt 149.0 lb

## 2012-07-26 DIAGNOSIS — R102 Pelvic and perineal pain: Secondary | ICD-10-CM

## 2012-07-26 DIAGNOSIS — N643 Galactorrhea not associated with childbirth: Secondary | ICD-10-CM

## 2012-07-26 DIAGNOSIS — Z139 Encounter for screening, unspecified: Secondary | ICD-10-CM

## 2012-07-26 DIAGNOSIS — N898 Other specified noninflammatory disorders of vagina: Secondary | ICD-10-CM

## 2012-07-26 DIAGNOSIS — N949 Unspecified condition associated with female genital organs and menstrual cycle: Secondary | ICD-10-CM

## 2012-07-26 LAB — PROLACTIN: Prolactin: 11 ng/mL

## 2012-07-26 LAB — HCG, SERUM, QUALITATIVE: Preg, Serum: NEGATIVE

## 2012-07-26 LAB — POCT WET PREP (WET MOUNT)
Clue Cells Wet Prep Whiff POC: NEGATIVE
WBC, Wet Prep HPF POC: NEGATIVE

## 2012-07-26 LAB — CBC
Platelets: 263 10*3/uL (ref 150–400)
RBC: 5.2 MIL/uL — ABNORMAL HIGH (ref 3.87–5.11)
WBC: 6.4 10*3/uL (ref 4.0–10.5)

## 2012-07-26 LAB — COMPREHENSIVE METABOLIC PANEL
ALT: 16 U/L (ref 0–35)
AST: 15 U/L (ref 0–37)
Albumin: 4.4 g/dL (ref 3.5–5.2)
Calcium: 10 mg/dL (ref 8.4–10.5)
Chloride: 103 mEq/L (ref 96–112)
Creat: 0.82 mg/dL (ref 0.50–1.10)
Potassium: 4.1 mEq/L (ref 3.5–5.3)
Sodium: 138 mEq/L (ref 135–145)
Total Protein: 7.3 g/dL (ref 6.0–8.3)

## 2012-07-26 LAB — TSH: TSH: 1.649 u[IU]/mL (ref 0.350–4.500)

## 2012-07-26 NOTE — Progress Notes (Signed)
C/o pelvic discomfort Feels like she is pregnant again Feels something kicking inside Feels like lactating 3rd depo recently after had paragard   Filed Vitals:   07/26/12 0946  BP: 104/64   ROS: noncontributory  Pelvic exam:  VULVA: normal appearing vulva with no masses, tenderness or lesions,  VAGINA: normal appearing vagina with normal color and discharge, no lesions, CERVIX: normal appearing cervix without discharge or lesions,  UTERUS: uterus is normal size, shape, consistency and nontender,  ADNEXA: normal adnexa in size, nontender and no masses.  A/P U/s Labs - tsh, prl, cbc, cmet, vit d Wet prep - neg

## 2012-07-27 LAB — VITAMIN D 25 HYDROXY (VIT D DEFICIENCY, FRACTURES): Vit D, 25-Hydroxy: 31 ng/mL (ref 30–89)

## 2012-08-23 ENCOUNTER — Ambulatory Visit (INDEPENDENT_AMBULATORY_CARE_PROVIDER_SITE_OTHER): Payer: Medicaid Other

## 2012-08-23 ENCOUNTER — Encounter: Payer: Self-pay | Admitting: Obstetrics and Gynecology

## 2012-08-23 ENCOUNTER — Ambulatory Visit (INDEPENDENT_AMBULATORY_CARE_PROVIDER_SITE_OTHER): Payer: Medicaid Other | Admitting: Obstetrics and Gynecology

## 2012-08-23 ENCOUNTER — Other Ambulatory Visit: Payer: Self-pay | Admitting: Obstetrics and Gynecology

## 2012-08-23 VITALS — BP 100/62 | Ht 61.0 in | Wt 149.0 lb

## 2012-08-23 DIAGNOSIS — E663 Overweight: Secondary | ICD-10-CM

## 2012-08-23 DIAGNOSIS — R102 Pelvic and perineal pain: Secondary | ICD-10-CM

## 2012-08-23 DIAGNOSIS — R197 Diarrhea, unspecified: Secondary | ICD-10-CM

## 2012-08-23 DIAGNOSIS — N949 Unspecified condition associated with female genital organs and menstrual cycle: Secondary | ICD-10-CM

## 2012-08-23 NOTE — Progress Notes (Signed)
Here to f/u u/s and reports sxs improving but now has diarrhea since this weekend improving.  Filed Vitals:   08/23/12 1643  BP: 100/62   U/s ut 7.0 x 4.6 x 3.0 cm, nl bil ovaries, no free fluid  A/P Refer to GI if sxs persist RTO for AEX August Keep hydrated in meantime, pt reports diarrhea is improving.

## 2013-07-20 ENCOUNTER — Ambulatory Visit: Payer: Medicaid Other | Admitting: Occupational Therapy

## 2013-08-16 ENCOUNTER — Ambulatory Visit: Payer: Medicaid Other | Admitting: Occupational Therapy

## 2014-01-25 ENCOUNTER — Encounter (HOSPITAL_COMMUNITY): Payer: Self-pay | Admitting: Emergency Medicine

## 2014-01-25 ENCOUNTER — Emergency Department (HOSPITAL_COMMUNITY)
Admission: EM | Admit: 2014-01-25 | Discharge: 2014-01-25 | Disposition: A | Discharge status: Home / Self Care | Payer: Medicaid Other | Attending: Emergency Medicine | Admitting: Emergency Medicine

## 2014-01-25 ENCOUNTER — Emergency Department (HOSPITAL_COMMUNITY): Payer: Medicaid Other

## 2014-01-25 DIAGNOSIS — Z8619 Personal history of other infectious and parasitic diseases: Secondary | ICD-10-CM | POA: Insufficient documentation

## 2014-01-25 DIAGNOSIS — S99919A Unspecified injury of unspecified ankle, initial encounter: Principal | ICD-10-CM

## 2014-01-25 DIAGNOSIS — IMO0002 Reserved for concepts with insufficient information to code with codable children: Secondary | ICD-10-CM | POA: Insufficient documentation

## 2014-01-25 DIAGNOSIS — Y9389 Activity, other specified: Secondary | ICD-10-CM | POA: Diagnosis not present

## 2014-01-25 DIAGNOSIS — S8000XA Contusion of unspecified knee, initial encounter: Secondary | ICD-10-CM | POA: Diagnosis not present

## 2014-01-25 DIAGNOSIS — Y92410 Unspecified street and highway as the place of occurrence of the external cause: Secondary | ICD-10-CM | POA: Insufficient documentation

## 2014-01-25 DIAGNOSIS — Z79899 Other long term (current) drug therapy: Secondary | ICD-10-CM | POA: Diagnosis not present

## 2014-01-25 DIAGNOSIS — Z8679 Personal history of other diseases of the circulatory system: Secondary | ICD-10-CM | POA: Insufficient documentation

## 2014-01-25 DIAGNOSIS — Z8742 Personal history of other diseases of the female genital tract: Secondary | ICD-10-CM | POA: Diagnosis not present

## 2014-01-25 DIAGNOSIS — S8990XA Unspecified injury of unspecified lower leg, initial encounter: Secondary | ICD-10-CM | POA: Diagnosis present

## 2014-01-25 DIAGNOSIS — S99929A Unspecified injury of unspecified foot, initial encounter: Principal | ICD-10-CM

## 2014-01-25 MED ORDER — TETANUS-DIPHTH-ACELL PERTUSSIS 5-2.5-18.5 LF-MCG/0.5 IM SUSP
0.5000 mL | Freq: Once | INTRAMUSCULAR | Status: AC
  Administered 2014-01-25: 0.5 mL via INTRAMUSCULAR
  Filled 2014-01-25: qty 0.5

## 2014-01-25 MED ORDER — OXYCODONE-ACETAMINOPHEN 5-325 MG PO TABS: 1.0000 | ORAL_TABLET | ORAL | Status: DC | PRN

## 2014-01-25 MED ORDER — NAPROXEN 500 MG PO TABS: 500.0000 mg | ORAL_TABLET | Freq: Two times a day (BID) | ORAL | Status: DC

## 2014-01-25 MED ORDER — IBUPROFEN 800 MG PO TABS
800.0000 mg | ORAL_TABLET | Freq: Once | ORAL | Status: AC
  Administered 2014-01-25: 800 mg via ORAL
  Filled 2014-01-25: qty 1
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
## 2014-02-01 NOTE — ED Provider Notes (Signed)
Medical screening examination/treatment/procedure(s) were performed by non-physician practitioner and as supervising physician I was immediately available for consultation/collaboration.   Jenalee Trevizo T Chardai Gangemi, MD 02/01/14 2322 

## 2014-07-05 ENCOUNTER — Encounter (HOSPITAL_COMMUNITY): Payer: Self-pay | Admitting: Emergency Medicine

## 2014-07-05 ENCOUNTER — Emergency Department (HOSPITAL_COMMUNITY)
Admission: EM | Admit: 2014-07-05 | Discharge: 2014-07-06 | Disposition: A | Discharge status: Home / Self Care | Payer: Medicaid Other | Attending: Emergency Medicine | Admitting: Emergency Medicine

## 2014-07-05 DIAGNOSIS — Z720 Tobacco use: Secondary | ICD-10-CM | POA: Diagnosis not present

## 2014-07-05 DIAGNOSIS — Z8619 Personal history of other infectious and parasitic diseases: Secondary | ICD-10-CM | POA: Insufficient documentation

## 2014-07-05 DIAGNOSIS — Z8742 Personal history of other diseases of the female genital tract: Secondary | ICD-10-CM | POA: Diagnosis not present

## 2014-07-05 DIAGNOSIS — Z008 Encounter for other general examination: Secondary | ICD-10-CM | POA: Insufficient documentation

## 2014-07-05 DIAGNOSIS — F329 Major depressive disorder, single episode, unspecified: Secondary | ICD-10-CM | POA: Diagnosis not present

## 2014-07-05 DIAGNOSIS — Z8679 Personal history of other diseases of the circulatory system: Secondary | ICD-10-CM | POA: Insufficient documentation

## 2014-07-05 DIAGNOSIS — F32A Depression, unspecified: Secondary | ICD-10-CM

## 2014-07-05 DIAGNOSIS — Z793 Long term (current) use of hormonal contraceptives: Secondary | ICD-10-CM | POA: Diagnosis not present

## 2014-07-05 DIAGNOSIS — R45851 Suicidal ideations: Secondary | ICD-10-CM | POA: Diagnosis present

## 2014-07-05 LAB — CBC
HCT: 40.8 % (ref 36.0–46.0)
Hemoglobin: 13.6 g/dL (ref 12.0–15.0)
MCH: 26.2 pg (ref 26.0–34.0)
MCHC: 33.3 g/dL (ref 30.0–36.0)
MCV: 78.5 fL (ref 78.0–100.0)
PLATELETS: 244 10*3/uL (ref 150–400)
RBC: 5.2 MIL/uL — AB (ref 3.87–5.11)
RDW: 12.9 % (ref 11.5–15.5)
WBC: 10.4 10*3/uL (ref 4.0–10.5)

## 2014-07-05 LAB — COMPREHENSIVE METABOLIC PANEL
ALBUMIN: 3.7 g/dL (ref 3.5–5.2)
ALT: 34 U/L (ref 0–35)
AST: 21 U/L (ref 0–37)
Alkaline Phosphatase: 54 U/L (ref 39–117)
Anion gap: 13 (ref 5–15)
BILIRUBIN TOTAL: 0.2 mg/dL — AB (ref 0.3–1.2)
BUN: 11 mg/dL (ref 6–23)
CHLORIDE: 100 meq/L (ref 96–112)
CO2: 25 meq/L (ref 19–32)
CREATININE: 0.77 mg/dL (ref 0.50–1.10)
Calcium: 9.2 mg/dL (ref 8.4–10.5)
GFR calc Af Amer: >90 mL/min (ref >90)
GFR calc non Af Amer: >90 mL/min (ref >90)
Glucose, Bld: 130 mg/dL — ABNORMAL HIGH (ref 70–99)
POTASSIUM: 3.5 meq/L — AB (ref 3.7–5.3)
Sodium: 138 mEq/L (ref 137–147)
Total Protein: 8 g/dL (ref 6.0–8.3)

## 2014-07-05 LAB — RAPID URINE DRUG SCREEN, HOSP PERFORMED
AMPHETAMINES: NOT DETECTED
BENZODIAZEPINES: POSITIVE — AB
Barbiturates: NOT DETECTED
COCAINE: NOT DETECTED
Opiates: NOT DETECTED
TETRAHYDROCANNABINOL: NOT DETECTED

## 2014-07-05 LAB — ACETAMINOPHEN LEVEL

## 2014-07-05 LAB — SALICYLATE LEVEL: Salicylate Lvl: <2 mg/dL — ABNORMAL LOW (ref 2.8–20.0)

## 2014-07-05 LAB — ETHANOL

## 2014-07-05 NOTE — ED Provider Notes (Signed)
CSN: [id number]     Arrival date & time 07/05/14  1929 History   First MD Initiated Contact with Patient 07/05/14 2101     Chief Complaint  Patient presents with  . Suicidal  . Anxiety  . Medical Clearance    (Consider location/radiation/quality/duration/timing/severity/associated sxs/prior Treatment) HPI Comments: 23 year old female presents to the emergency department for further assistance with her depression and suicidal ideations. Patient states that she has been dealing with depression for many years. She states that she saw therapy approximately 7-8 years ago. She was also on antidepressants at one time, but states that this was for treatment of chronic migraines. Patient does state, however, that she felt "happier" when on this medication. Patient denies any specific suicidal plan. She states that she has cut herself in the past. Patient states that she is seeking help because her friend prompted her to. She also states that she has "a lot to live for" because she has a small daughter and wants to "see her grow up". When asked if she will kill herself if she leave the emergency department, patient replies "no". She denies homicidal ideations, ethanol use, and illicit drug use.  Patient is a 23 y.o. female presenting with anxiety. The history is provided by the patient. No language interpreter was used.  Anxiety    Past Medical History  Diagnosis Date  . Headache(784.0)     hx of migraines  . Multiple food allergies   . H/O varicella   . Mastitis 10/01/11   Past Surgical History  Procedure Laterality Date  . Wisdom tooth extraction  2011   Family History  Problem Relation Age of Onset  . Alcohol abuse Father   . Asthma Father   . Heart disease Father   . Heart attack Father   . Cancer Maternal Grandfather   . Anesthesia problems Neg Hx   . Cancer Maternal Aunt    History  Substance Use Topics  . Smoking status: Current Every Day Smoker -- 0.25 packs/day    Types:  Cigarettes  . Smokeless tobacco: Never Used  . Alcohol Use: Yes   OB History   Grav Para Term Preterm Abortions TAB SAB Ect Mult Living   1 1 0 1 0 0 0 0 0 1       Review of Systems  Psychiatric/Behavioral: Positive for suicidal ideas and behavioral problems.  All other systems reviewed and are negative.   Allergies  Review of patient's allergies indicates no known allergies.  Home Medications   Prior to Admission medications   Medication Sig Start Date End Date Taking? Authorizing Provider  norethindrone-ethinyl estradiol (JUNEL FE,GILDESS FE,LOESTRIN FE) 1-20 MG-MCG tablet Take 1 tablet by mouth daily.   Yes Historical Provider, MD   BP 110/69  Pulse 68  Temp(Src) 98.1 F (36.7 C) (Oral)  Resp 20  Ht 5\' 1"  (1.549 m)  Wt 170 lb (77.111 kg)  BMI 32.14 kg/m2  SpO2 100%  Physical Exam  Nursing note and vitals reviewed. Constitutional: She is oriented to person, place, and time. She appears well-developed and well-nourished. No distress.  HENT:  Head: Normocephalic and atraumatic.  Eyes: Conjunctivae and EOM are normal. No scleral icterus.  Neck: Normal range of motion.  Pulmonary/Chest: Effort normal. No respiratory distress.  Musculoskeletal: Normal range of motion.  Neurological: She is alert and oriented to person, place, and time.  Skin: Skin is warm and dry. No rash noted. She is not diaphoretic. No erythema. No pallor.  Psychiatric: Her speech is  normal. She is withdrawn. Cognition and memory are normal. She exhibits a depressed mood. She expresses suicidal ideation. She expresses no homicidal ideation. She expresses no suicidal plans and no homicidal plans.    ED Course  Procedures (including critical care time) Labs Review Labs Reviewed  CBC - Abnormal; Notable for the following:    RBC 5.20 (*)    All other components within normal limits  COMPREHENSIVE METABOLIC PANEL - Abnormal; Notable for the following:    Potassium 3.5 (*)    Glucose, Bld 130 (*)     Total Bilirubin 0.2 (*)    All other components within normal limits  SALICYLATE LEVEL - Abnormal; Notable for the following:    Salicylate Lvl <2.0 (*)    All other components within normal limits  URINE RAPID DRUG SCREEN (HOSP PERFORMED) - Abnormal; Notable for the following:    Benzodiazepines POSITIVE (*)    All other components within normal limits  ACETAMINOPHEN LEVEL  ETHANOL    Imaging Review No results found.   EKG Interpretation None      MDM   Final diagnoses:  Depression    23 year old female presents to the emergency department for further evaluation of worsening depression. Patient endorses vague suicidal thoughts, but denies suicidal plan. Patient is able to contract for safety on my assessment. She also contracts for safety with ACT team member. ACT team recommends outpatient followup as well as primary care followup to initiate course of antidepressants. Patient agreeable with this plan and has agreed to sign a no harm contract prior to discharge. Return precautions and resource guide provided. Patient agreeable to plan with no unaddressed concerns.   Filed Vitals:   07/05/14 1940 07/05/14 2340  BP: 132/83 110/69  Pulse: 109 68  Temp: 98.3 F (36.8 C) 98.1 F (36.7 C)  TempSrc: Oral Oral  Resp: 18 20  Height: 5\' 1"  (1.549 m)   Weight: 170 lb (77.111 kg)   SpO2: 100% 100%      Antony Madura, PA-C 07/06/14 [phone]

## 2014-07-05 NOTE — ED Notes (Signed)
Pt changing into scrubs and security paged.

## 2014-07-05 NOTE — ED Notes (Addendum)
[facility] regarding when pt's telepsych is to be completed.  Stated they were working as diligently as possible to complete assessments.

## 2014-07-05 NOTE — ED Notes (Signed)
Patient arrives with complaint of suicidal ideations. States "they began years ago" and she has not sought treatment before now. Presents tonight for help at the urging of friend. States that she doesn't have any specific plans, but has cut herself in the past. States that she cut herself a couple days ago and points to right arm. Small abrasion present on forearm. No apparent laceration, or scars. Additionally states that she has been dealing with a lot of anxiety which is accompanied by chest pains and shortness of breath.

## 2014-07-06 NOTE — ED Notes (Signed)
Pt updated on delay in telepsych.  Pt given paper and crayons to draw with per request.

## 2014-07-06 NOTE — ED Notes (Signed)
Pt given no harm contract to sign as well as resources and belongings.

## 2014-07-06 NOTE — Discharge Instructions (Signed)
°Depression °Depression refers to feeling sad, low, down in the dumps, blue, gloomy, or empty. In general, there are two kinds of depression: °1. Normal sadness or normal grief. This kind of depression is one that we all feel from time to time after upsetting life experiences, such as the loss of a job or the ending of a relationship. This kind of depression is considered normal, is short lived, and resolves within a few days to 2 weeks. Depression experienced after the loss of a loved one (bereavement) often lasts longer than 2 weeks but normally gets better with time. °2. Clinical depression. This kind of depression lasts longer than normal sadness or normal grief or interferes with your ability to function at home, at work, and in school. It also interferes with your personal relationships. It affects almost every aspect of your life. Clinical depression is an illness. °Symptoms of depression can also be caused by conditions other than those mentioned above, such as: °· Physical illness. Some physical illnesses, including underactive thyroid gland (hypothyroidism), severe anemia, specific types of cancer, diabetes, uncontrolled seizures, heart and lung problems, strokes, and chronic pain are commonly associated with symptoms of depression. °· Side effects of some prescription medicine. In some people, certain types of medicine can cause symptoms of depression. °· Substance abuse. Abuse of alcohol and illicit drugs can cause symptoms of depression. °SYMPTOMS °Symptoms of normal sadness and normal grief include the following: °· Feeling sad or crying for short periods of time. °· Not caring about anything (apathy). °· Difficulty sleeping or sleeping too much. °· No longer able to enjoy the things you used to enjoy. °· Desire to be by oneself all the time (social isolation). °· Lack of energy or motivation. °· Difficulty concentrating or remembering. °· Change in appetite or weight. °· Restlessness or  agitation. °Symptoms of clinical depression include the same symptoms of normal sadness or normal grief and also the following symptoms: °· Feeling sad or crying all the time. °· Feelings of guilt or worthlessness. °· Feelings of hopelessness or helplessness. °· Thoughts of suicide or the desire to harm yourself (suicidal ideation). °· Loss of touch with reality (psychotic symptoms). Seeing or hearing things that are not real (hallucinations) or having false beliefs about your life or the people around you (delusions and paranoia). °DIAGNOSIS  °The diagnosis of clinical depression is usually based on how bad the symptoms are and how long they have lasted. Your health care provider will also ask you questions about your medical history and substance use to find out if physical illness, use of prescription medicine, or substance abuse is causing your depression. Your health care provider may also order blood tests. °TREATMENT  °Often, normal sadness and normal grief do not require treatment. However, sometimes antidepressant medicine is given for bereavement to ease the depressive symptoms until they resolve. °The treatment for clinical depression depends on how bad the symptoms are but often includes antidepressant medicine, counseling with a mental health professional, or both. Your health care provider will help to determine what treatment is best for you. °Depression caused by physical illness usually goes away with appropriate medical treatment of the illness. If prescription medicine is causing depression, talk with your health care provider about stopping the medicine, decreasing the dose, or changing to another medicine. °Depression caused by the abuse of alcohol or illicit drugs goes away when you stop using these substances. Some adults need professional help in order to stop drinking or using drugs. °SEEK IMMEDIATE MEDICAL   CARE IF: °· You have thoughts about hurting yourself or others. °· You lose touch  with reality (have psychotic symptoms). °· You are taking medicine for depression and have a serious side effect. °FOR MORE INFORMATION °· National Alliance on Mental Illness: www.nami.org  °· National Institute of Mental Health: www.nimh.nih.gov  °Document Released: 09/11/2000 Document Revised: 01/29/2014 Document Reviewed: 12/14/2011 °ExitCare® Patient Information ©2015 [employer]. This information is not intended to replace advice given to you by your health care provider. Make sure you discuss any questions you have with your health care provider. °  Emergency Department Resource Guide °1) Find a Doctor and Pay Out of Pocket °Although you won't have to find out who is covered by your insurance plan, it is a good idea to ask around and get recommendations. You will then need to call the office and see if the doctor you have chosen will accept you as a new patient and what types of options they offer for patients who are self-pay. Some doctors offer discounts or will set up payment plans for their patients who do not have insurance, but you will need to ask so you aren't surprised when you get to your appointment. ° °2) Contact Your Local Health Department °Not all health departments have doctors that can see patients for sick visits, but many do, so it is worth a call to see if yours does. If you don't know where your local health department is, you can check in your phone book. The CDC also has a tool to help you locate your state's health department, and many state websites also have listings of all of their local health departments. ° °3) Find a Walk-in Clinic °If your illness is not likely to be very severe or complicated, you may want to try a walk in clinic. These are popping up all over the country in pharmacies, drugstores, and shopping centers. They're usually staffed by nurse practitioners or physician assistants that have been trained to treat common illnesses and complaints. They're usually fairly  quick and inexpensive. However, if you have serious medical issues or chronic medical problems, these are probably not your best option. ° °No Primary Care Doctor: °- Call [facility] at  832-8000 - they can help you locate a primary care doctor that  accepts your insurance, provides certain services, etc. °- Physician Referral Service- 1-800-533-3463 ° °Chronic Pain Problems: °Organization         Address  Phone   Notes  °[facility]  (336) 297-2271 Patients need to be referred by their primary care doctor.  ° °Medication Assistance: °Organization         Address  Phone   Notes  °[place] [facility] [address] °[place], [place] [address] (336) 641-8030 --Must be a resident of [place] °-- Must have NO insurance coverage whatsoever (no Medicaid/ Medicare, etc.) °-- The pt. MUST have a primary care doctor that directs their care regularly and follows them in the community °  °[facility]  (866) 331-1348   °[employer]  (888) 892-1162   ° °Agencies that provide inexpensive medical care: °Organization         Address  Phone   Notes  °[facility]  (336) 832-8035   °[facility]    (336) 832-7272   °[facility] [address] °[place], [place] [address] (336) 832-4777   °[facility] [address], °[place] (336) 271-4999   °  [facility]    (336) 373-0678   °[facility]    (336) 272-1050   °[facility] ° [address], [place] Phone:  (336) 832-4444, Fax:  (336) 832-4440 Hours of Operation:  9 am - 6 pm, M-F.  Also accepts Medicaid/Medicare and self-pay.  °[facility] ° [address], [place] Phone: (336) 832-3150, Fax: (336) [phone]. Hours of Operation:  8:30 am - 5:30 pm, M-F.  Also accepts Medicaid and self-pay.  °[facility] [place] [address], [place] Phone: (336) 878-6027    °[facility] [address], [place], [place] (336)723-1848, Ext. 123 Mondays & Thursdays: 7-9 AM.  First 15 patients are seen on a first come, first serve basis. °  ° °Medicaid-accepting [place] Providers: ° °Organization         Address  Phone   Notes  °[facility] [address], [place] (336) 641-2100 Also accepts self-pay patients.  °[facility] [address], [place] ° (336) 856-9996   °[facility] [address], [place] (336) 288-8857   °[facility] [address] [place] [address], [place] (336) 299-7000   °Veita Bland [address], [place]  ° (336) 373-1557 Only accepts [place] Medicaid patients after they have their name applied to their card.  ° °Self-Pay (no insurance) in [place]: ° °Organization         Address  Phone   Notes  °Sickle Cell Patients, [facility] [address], [place] (336) 832-1970   °[facility] [address], [place] (336) 832-4400   °[facility] ° [address], [place] (336) 992-4800   °[facility]/Dr. Osei-Bonsu ° [address] [place] [address], [place] or [address], [place] (336) 841-8500 Phone number for both [place] and [place] locations is the same.  °[facility] [address], [place] (336) 299-0000   °[facility] [address] [place] [address], [place] or [address] (336) 852-7530 °(336) 878-2260   °[facility] [address], [place] (336) 350-1642, phone; (336) 294-5005, fax Sees patients 1st and 3rd Saturday of every month.  Must not qualify for public or private insurance (i.e. Medicaid, Medicare, [place] Health Choice, Veterans' Benefits) • Household income should be no more than 200% of the poverty level •The clinic cannot treat you if you are  pregnant or think you are pregnant • Sexually transmitted diseases are not treated at the clinic.  ° °Dental Care: °Organization         Address  Phone  Notes  °[facility] [address],  (336) 641-6152 Accepts children up to age 21 who are enrolled in Medicaid or [place] Health Choice; pregnant women with a Medicaid card; and children who have applied for Medicaid or [place] Health Choice, but were declined, whose parents can pay a reduced fee at time of service.  °[facility] [place]  [address], [place] (336) 641-7733 Accepts children up to age 21 who are enrolled in Medicaid or [place] Health Choice; pregnant women with a Medicaid card; and children who have applied for Medicaid or [place] Health Choice, but were declined, whose   parents can pay a reduced fee at time of service.  °[facility] ° [address], [place] (336) 641-4533 Patients are seen by appointment only. Walk-ins are not accepted. [facility] will see patients 18 years of age and older. °Monday - Tuesday (8am-5pm) °Most Wednesdays (8:30-5pm) °$30 per visit, cash only  °[facility] ° [address], [place] (336) 641-4533 Patients are seen by appointment only. Walk-ins are not accepted. [facility] will see patients 18 years of age and older. °One Wednesday Evening (Monthly: Volunteer Based).  $30 per visit, cash only  °[facility]  (919) 537-3737 for adults; Children under age 4, call Graduate Pediatric Dentistry at (919) [phone]. Children aged 4-14, please call (919) [phone] to request a pediatric application. ° Dental services are provided in all areas of dental care including fillings, crowns and bridges, complete and partial dentures, implants, gum treatment, root canals, and extractions. Preventive care is also provided. Treatment is provided to  both adults and children. °Patients are selected via a lottery and there is often a waiting list. °  °[facility] [address], °[employer] [place] ° (336) 763-8833 www.drcivils.com °  °[facility] [address], [place], [place] (336)723-1848, Ext. 123 Second and Fourth Thursday of each month, opens at 6:30 AM; Clinic ends at 9 AM.  Patients are seen on a first-come first-served basis, and a limited number are seen during each clinic.  ° °[facility] ° [address], [place], [place] (336) 723-7904   Eligibility Requirements °You must have lived in [place], [place], or [place] counties for at least the last three months. °  You cannot be eligible for state or federal sponsored healthcare insurance, including Veterans Administration, Medicaid, or Medicare. °  You generally cannot be eligible for healthcare insurance through your employer.  °  How to apply: °Eligibility screenings are held every Tuesday and Wednesday afternoon from 1:00 pm until 4:00 pm. You do not need an appointment for the interview!  °[facility] [address], [place], [place] 336-631-2330   °[facility]  336-342-8273   °[facility]  336-703-3100   °[facility]  336-570-6415   ° °Behavioral Health Resources in the Community: °Intensive Outpatient Programs °Organization         Address  Phone  Notes  °[facility] [address], [place], [place] 336-878-6098   °[facility] [address], [place], [place] 336-832-9800   °[facility] [address], [place], [place] ° 336-882-2125   °[facility] [address],  °[place], [place] 1-800-853-5163 or 336-641-4981   °Substance Abuse Resources °Organization         Address  Phone  Notes  °[facility]  336-882-2125   °[facility]  336-784-9470   °[facility]   336-285-9073   °[facility]  336-845-3988   °Residential & Outpatient Substance Abuse Program  1-800-659-3381   °Psychological Services °Organization         Address  Phone  Notes  °[facility]  336- 832-9600   °[facility]  336- 378-7881   °[facility] [address], [place] 1-800-853-5163 or 336-641-4981   ° °Mobile Crisis Teams °Organization         Address  Phone  Notes  °[facility], Mobile Crisis   Care Unit  1-877-626-1772  °Assertive °[facility] ° [address] [place], [place] 336-834-9664  °Sharon DeEsch [address] °[place] 336-554-5454  ° °Self-Help/Support Groups °Organization         Address  Phone             Notes  °[facility] - variety of support groups  336- 373-1402 Call for more information  °Narcotics Anonymous (NA), [facility] [address], °[place]  2 meetings at this location  ° °Residential Treatment Programs °Organization         Address  Phone  Notes  °[facility] [address],    °[place]  1-866-801-8205   °[facility] ° [address], [place], [place] 704-293-8524   °[facility] [address], [place] 336-845-3988 Admissions: 8am-3pm M-F  °[facility] [address]    °[place], [place] 336-841-1104   °[facility] [address], [place], [place] 336-379-7146   °[facility] [address]  °[place], [place] 336-285-9073   °[facility] Intensive Outpatient [address], [place], [place] 336-852-3033   °[facility] [address]  °[place], [place] 1-877-615-2722 or 336-784-9470   °[facility] [address] [place], [place] 336-227-7417 Accepts Medicaid  °[facility] [address]  °[place] 1-800-659-3381 Substance Abuse/Addiction Treatment  ° °[place] Behavioral Health Resources °Organization         Address  Phone  Notes  °[facility]  (888) 581-9988   °Julie Brannon, PhD [address] [place], [place]   (336) 349-5553 or (336) 951-0000   °[facility]   [address] °[place], [place] (336) 349-4454   °[facility] [address], [place], [place] (336) 342-8316 Insurance/Medicaid/sponsorship through [facility]  °[facility] [address]                                    [place], [place] (336) 342-8316 Therapy/tele-psych/case  °[facility] [address]  ° [place], [place] (336) 349-2233    °Dr. Arfeen  (336) 349-4544   °[facility] [place]  [employer] [place] [facility] 1) [address], [place] °2) [address], [place] °3)  [address], [place] (336) 349-3220 °(336) 342-7768 ° °(336) 342-8140   °[place] [facility] (336) 342-1394 or (336) 342-3537 (After Hours)    ° °   °

## 2014-07-06 NOTE — BH Assessment (Signed)
Tele Assessment Note   April HawkingJasmine R Merritt is an 23 y.o. female presenting to ED at the urging of a friend. Pt reports she has struggled with depression, anxiety, and SI for years without treatment. Pt reports she feels like no would be a good time to get help. Pt is alert and oriented times 4. Mood is depressed, and anxious with affect congruent. Speech is soft, logical and coherent. Judgement is intact. Pt denies HI, SA, and auditory hallucinations. She reports seeing things, but only in really dark areas, she sees shadows. These do no appear to be hallucinations.   Pt reports stressors, conflict with mom, daughter staying with her mom, feeling not supported, and being on break from fiance who's nephew just died. PT reports she is attending college and has a job.   Pt reports depression for as long as she can remember, feeling sad, crying easily, shutting down at times, not talking, feeling hopeless/helpless, and isolating. She does not have vegetative symptoms. Pt reports intermittent SI without intent. She reports no past suicidal gestures. She reports she has considered hanging or overdose in past because someone she knows tried those things. She reports she did "think up" those plans but just knew someone else who had tried them. She denies current planning or intent.  Pt reports feeling anxious most of the time, about big and small things. She reports increasing panic attacks triggered by stressful situations. She has been having them up to five times per day. Pt denies hx of trauma or abuse.   Axis I:  296.23 Major Depressive Disorder, chronic, severe  300.02 Generalized Anxiety Disorder, with panic attacks Axis II: Deferred Axis III:  Past Medical History  Diagnosis Date  . Headache(784.0)     hx of migraines  . Multiple food allergies   . H/O varicella   . Mastitis 10/01/11   Axis IV: other psychosocial or environmental problems, problems related to social environment and problems with  primary support group Axis V: 41-50 serious symptoms  Past Medical History:  Past Medical History  Diagnosis Date  . Headache(784.0)     hx of migraines  . Multiple food allergies   . H/O varicella   . Mastitis 10/01/11    Past Surgical History  Procedure Laterality Date  . Wisdom tooth extraction  2011    Family History:  Family History  Problem Relation Age of Onset  . Alcohol abuse Father   . Asthma Father   . Heart disease Father   . Heart attack Father   . Cancer Maternal Grandfather   . Anesthesia problems Neg Hx   . Cancer Maternal Aunt     Social History:  reports that she has been smoking Cigarettes.  She has been smoking about 0.25 packs per day. She has never used smokeless tobacco. She reports that she drinks alcohol. She reports that she does not use illicit drugs.  Additional Social History:  Alcohol / Drug Use Pain Medications: none Prescriptions: none Over the Counter: none History of alcohol / drug use?:  (reports a couple of drinks a 2-3 times per month, tsted positive for benzos) Longest period of sobriety (when/how long):  (NA) Negative Consequences of Use:  (none) Withdrawal Symptoms:  (none)  CIWA: CIWA-Ar BP: 110/69 mmHg Pulse Rate: 68 COWS:    PATIENT STRENGTHS: (choose at least two) Communication skills Motivation for treatment/growth  Allergies: No Known Allergies  Home Medications:  (Not in a hospital admission)  OB/GYN Status:  No LMP recorded. Patient  is not currently having periods (Reason: Oral contraceptives).  General Assessment Data [facility] Is this a Tele or Face-to-Face Assessment?: Tele Assessment Is this an Initial Assessment or a Re-assessment for this encounter?: Initial Assessment Living Arrangements: Other relatives (grandmother) Can pt return to current living arrangement?: Yes Admission Status: Voluntary Is patient capable of signing voluntary admission?: Yes Transfer from: Home Referral  Source: Self/Family/Friend     [facility] Living Arrangements: Other relatives (grandmother) Name of Psychiatrist: none Name of Therapist: none  Education Status Is patient currently in school?: Yes Current Grade: senior in college Highest grade of school patient has completed: [employer] 3 years Name of school: [employer] Contact person: na  Risk to self with the past 6 months Suicidal Ideation: Yes-Currently Present Suicidal Intent: No Is patient at risk for suicide?: No Suicidal Plan?: No-Not Currently/Within Last 6 Months Access to Means: Yes Specify Access to Suicidal Means: medications or hanging  What has been your use of drugs/alcohol within the last 12 months?: Pt reports she drinks 2-3 times per month a couple of drinks.Denies other substance use Previous Attempts/Gestures: No How many times?: 0 Other Self Harm Risks: none Triggers for Past Attempts: None known Intentional Self Injurious Behavior: Cutting Comment - Self Injurious Behavior: cut self 2 days ago the first time in 2 years Family Suicide History: No Recent stressful life event(s): Conflict (Comment) (with mother, feels unsupported) Persecutory voices/beliefs?: No Depression: Yes Depression Symptoms: Tearfulness;Isolating;Guilt;Loss of interest in usual pleasures;Despondent Substance abuse history and/or treatment for substance abuse?: No Suicide prevention information given to non-admitted patients: Yes  Risk to Others within the past 6 months Homicidal Ideation: No Thoughts of Harm to Others: No Current Homicidal Intent: No Current Homicidal Plan: No Access to Homicidal Means: No Identified Victim: none History of harm to others?: No Assessment of Violence: None Noted Violent Behavior Description: none Does patient have access to weapons?: No Criminal Charges Pending?: No Does patient have a court date: No  Psychosis Hallucinations: None noted Delusions: None noted  Mental Status  Report Appear/Hygiene: Unremarkable;In scrubs Eye Contact: Good Motor Activity: Unremarkable Speech: Logical/coherent Level of Consciousness: Alert Mood: Anxious;Depressed Affect: Appropriate to circumstance Anxiety Level: Panic Attacks Panic attack frequency: up to five per day Most recent panic attack: yesterday Thought Processes: Coherent;Relevant Judgement: Unimpaired Orientation: Person;Place;Time;Situation Obsessive Compulsive Thoughts/Behaviors: None  Cognitive Functioning Concentration: Normal Memory: Recent Intact;Remote Intact IQ: Average Insight: Good Impulse Control: Good Appetite: Fair Weight Loss: 3 Weight Gain: 0 Sleep: Decreased Total Hours of Sleep: 5 Vegetative Symptoms: None  ADLScreening [facility] Assessment Services) Patient's cognitive ability adequate to safely complete daily activities?: Yes Patient able to express need for assistance with ADLs?: Yes Independently performs ADLs?: Yes (appropriate for developmental age)  Prior Inpatient Therapy Prior Inpatient Therapy: No Prior Therapy Dates: na Prior Therapy Facilty/Provider(s): na Reason for Treatment: na  Prior Outpatient Therapy Prior Outpatient Therapy: No Prior Therapy Dates: na Prior Therapy Facilty/Provider(s): na Reason for Treatment: na  ADL Screening (condition at time of admission) Patient's cognitive ability adequate to safely complete daily activities?: Yes Is the patient deaf or have difficulty hearing?: No Does the patient have difficulty seeing, even when wearing glasses/contacts?: No Does the patient have difficulty concentrating, remembering, or making decisions?: No Patient able to express need for assistance with ADLs?: Yes Does the patient have difficulty dressing or bathing?: No Independently performs ADLs?: Yes (appropriate for developmental age) Does the patient have difficulty walking or climbing stairs?: No Weakness of Legs:  None Weakness of Arms/Hands: None        Abuse/Neglect Assessment (Assessment to be complete while patient is alone) Physical Abuse: Denies Verbal Abuse: Denies Sexual Abuse: Denies Exploitation of patient/patient's resources: Denies Self-Neglect: Denies Values / Beliefs Cultural Requests During Hospitalization: None Spiritual Requests During Hospitalization: None   Advance Directives (For Healthcare) Does patient have an advance directive?: No Would patient like information on creating an advanced directive?: No - patient declined information Nutrition Screen- [facility] Adult/WL/AP Patient's home diet: Regular  Additional Information 1:1 In Past 12 Months?: No CIRT Risk: No Elopement Risk: No Does patient have medical clearance?: Yes     Disposition:  Pt does not meet inpt criteria and should be discharged with OP resources. Should contact PCP in AM, and set up appointments with counselor and psychiatrist.  Clista Bernhardt, [facility] Triage Specialist 07/06/2014 3:51 AM

## 2014-07-06 NOTE — BH Assessment (Signed)
Pt does not meet inpt criteria and is able to contract for safety. She can be discharged with OP resources after signing no harm contract. Pt to follow up with PCP tomorrow. She has been educated on how access OP resources, and how to talk to PCP about her MH concerns. She has been instructed on how to have PCP request copy of her assessment.   Informed EDP of recommendations, and she is in agreement. Faxed referrals to RN.   Clista BernhardtNancy Jayzen Paver, [facility] Triage Specialist 07/06/2014 3:35 AM

## 2014-07-10 NOTE — ED Provider Notes (Signed)
  Medical screening examination/treatment/procedure(s) were performed by non-physician practitioner and as supervising physician I was immediately available for consultation/collaboration.   EKG Interpretation   Date/Time:  Thursday July 05 2014 19:41:11 EDT Ventricular Rate:  115 PR Interval:  120 QRS Duration: 82 QT Interval:  288 QTC Calculation: 398 R Axis:   84 Text Interpretation:  Sinus tachycardia Nonspecific T wave abnormality  Abnormal ECG ED PHYSICIAN INTERPRETATION AVAILABLE IN [facility]  Confirmed by TEST, Record ([id number]) on 07/07/2014 10:02:09 AM         Gerhard Munchobert Natascha Edmonds, MD 07/10/14 1453

## 2014-07-30 ENCOUNTER — Encounter (HOSPITAL_COMMUNITY): Payer: Self-pay | Admitting: Emergency Medicine

## 2015-06-06 ENCOUNTER — Ambulatory Visit
Admission: RE | Admit: 2015-06-06 | Discharge: 2015-06-06 | Disposition: A | Discharge status: Home / Self Care | Payer: Medicaid Other | Source: Ambulatory Visit | Attending: Family Medicine | Admitting: Family Medicine

## 2015-06-06 ENCOUNTER — Other Ambulatory Visit: Payer: Self-pay | Admitting: Family Medicine

## 2015-06-06 DIAGNOSIS — M545 Low back pain: Secondary | ICD-10-CM

## 2016-01-28 ENCOUNTER — Ambulatory Visit: Payer: Medicaid Other

## 2016-01-30 ENCOUNTER — Ambulatory Visit: Payer: Medicaid Other | Admitting: Physical Therapy

## 2016-02-05 ENCOUNTER — Ambulatory Visit: Payer: Medicaid Other | Attending: Family Medicine | Admitting: Physical Therapy

## 2016-02-05 ENCOUNTER — Encounter: Payer: Self-pay | Admitting: Physical Therapy

## 2016-02-05 DIAGNOSIS — M5440 Lumbago with sciatica, unspecified side: Secondary | ICD-10-CM | POA: Diagnosis present

## 2016-02-05 DIAGNOSIS — R252 Cramp and spasm: Secondary | ICD-10-CM

## 2016-02-05 DIAGNOSIS — M5416 Radiculopathy, lumbar region: Secondary | ICD-10-CM | POA: Insufficient documentation

## 2016-02-05 NOTE — Patient Instructions (Addendum)

## 2016-02-05 NOTE — Therapy (Signed)
[facility] [address] [place], [place], [address] Phone: [phone]   Fax:  [phone]  Physical Therapy Evaluation  Patient Details  Name: April Merritt MRN: [id number] Date of Birth: Jan 06, 1991 Referring Provider: Malachy Chamber MD  Encounter Date: 02/05/2016      PT End of Session - 02/05/16 1443    Visit Number 1   Number of Visits 1   Date for PT Re-Evaluation 02/06/16   Authorization Type Medicaid   PT Start Time 1338   PT Stop Time 1424   PT Time Calculation (min) 46 min   Activity Tolerance Patient tolerated treatment well   Behavior During Therapy [facility] for tasks assessed/performed      Past Medical History  Diagnosis Date  . Headache(784.0)     hx of migraines  . Multiple food allergies   . H/O varicella   . Mastitis 10/01/11  . Depression   . Anxiety     Past Surgical History  Procedure Laterality Date  . Wisdom tooth extraction  2011    There were no vitals filed for this visit.       Subjective Assessment - 02/05/16 1343    Subjective pt is a 25 y.o F with CC of Low back tha thas been going for "as long as I can remember" with no specific mechanism of injury. pain starts in the lower back and radiates down the L leg but has recently started going down the RLE reported as numbness, and up the back as well in the arms. reports the pain seems to be getting worse with weakness  and t   Limitations Sitting;Standing;Walking;Lifting   How long can you sit comfortably? 10 min   How long can you stand comfortably? hours   How long can you walk comfortably? hours   Diagnostic tests 06/06/2015 x-ray    Patient Stated Goals decrease pain, increase strength    Currently in Pain? Yes   Pain Score 6    Pain Location Back   Pain Orientation Mid;Lower   Pain Descriptors / Indicators Shooting;Aching;Stabbing   Pain Type Chronic pain   Pain Radiating Towards Radiate down L LE,    Pain Onset More than a  month ago   Pain Frequency Constant   Aggravating Factors  bending, lifting, reaching, side to side motion,    Pain Relieving Factors stretching,    Effect of Pain on Daily Activities limited strength, mobility , endurance            [facility] PT Assessment - 02/05/16 1354    Assessment   Medical Diagnosis Low back pain   Referring Provider April Chamber MD   Onset Date/Surgical Date --  as long as she can remember   Hand Dominance Left   Next MD Visit make one PRN   Prior Therapy no   Precautions   Precautions None   Restrictions   Weight Bearing Restrictions No   Balance Screen   Has the patient fallen in the past 6 months No   Has the patient had a decrease in activity level because of a fear of falling?  No   Is the patient reluctant to leave their home because of a fear of falling?  No   Home Environment   Living Environment Private residence   Living Arrangements Alone   Type of Home Apartment   Home Access Stairs to enter   Entrance Stairs-Number of Steps 14   Home Layout One level  Home Equipment None   Prior Function   Level of Independence Independent;Independent with basic ADLs   Vocation Part time employment  2 jobs, [employer], [employer]   Vocation Requirements prolonged walking/ standing, movning, lifting, carrying, stretching,    Leisure relaxing and watching TV   Cognition   Overall Cognitive Status Within Functional Limits for tasks assessed   Posture/Postural Control   Posture/Postural Control Postural limitations   Postural Limitations Rounded Shoulders;Forward head   ROM / Strength   AROM / PROM / Strength AROM;PROM;Strength   AROM   AROM Assessment Site Lumbar   Lumbar Flexion 110   Lumbar Extension 32   Lumbar - Right Side Bend 22   Lumbar - Left Side Bend 18   Strength   Strength Assessment Site Hip;Knee   Right/Left Hip Right;Left   Right Hip Flexion 5/5   Right Hip Extension 5/5   Right Hip ABduction 5/5   Right Hip ADduction  5/5   Left Hip Flexion 5/5   Left Hip Extension 5/5   Left Hip ABduction 5/5   Left Hip ADduction 5/5   Right/Left Knee Left;Right   Right Knee Flexion 5/5   Right Knee Extension 5/5   Left Knee Flexion 5/5   Left Knee Extension 5/5   Special Tests    Special Tests Lumbar   Lumbar Tests Straight Leg Raise;Prone Knee Bend Test;Slump Test                           PT Education - 02/05/16 1442    Education provided Yes   Education Details evaluation findings, HEP, HOPE clinic handout, posture education   Person(s) Educated Patient   Methods Explanation;Handout;Demonstration   Comprehension Verbalized understanding                    Plan - 02/05/16 1444    Clinical Impression Statement [name] presents to OPPT as low complexity evaluation with CC of chronic low back pain. She demonstrates functional AROM with trunk movement with pinching pain noted with extension and recreation of numbness with flexion. Strength in the legs with s WNL.palpation revealed tendneress at L1-L4 with spasm noted in bil parapsinals. special testing is postive for ipsilateral SLR with referral down the LLE, and demonstrates a extension bias. with prone press-ups she demonstrates centralization, that conitnues to improve to her anterior thigh with bridging activity. based on subjective history and examination she demonstrates positive findings for discogenic pathology with peripheralization with flexion and centraliztion with extension. provided HEP and educated about all exercises as well as posture education handout. Gave pt [facility]. Answered questions regarding anatomy of the low back and discs.     PT Frequency 1x / week   PT Duration --  1 x evaluation only   PT Next Visit Plan Medicaid 1 x evaluation only   PT Home Exercise Plan see pt instructoin   Consulted and Agree with Plan of Care Patient      Patient will benefit from skilled  therapeutic intervention in order to improve the following deficits and impairments:  Pain, Improper body mechanics, Postural dysfunction, Decreased activity tolerance, Decreased endurance, Decreased range of motion, Hypomobility  Visit Diagnosis: Midline low back pain with sciatica, sciatica laterality unspecified - Plan: PT plan of care cert/re-cert  Radiculopathy, lumbar region - Plan: PT plan of care cert/re-cert  Cramp and spasm - Plan: PT plan of care cert/re-cert  Problem List Patient Active Problem List   Diagnosis Date Noted  . Abdominal pain 02/01/2012  . Menorrhagia 02/01/2012  . Overweight(278.02) 02/01/2012  . Headache(784.0)   . SVD (spontaneous vaginal delivery) 09/11/2011   Lulu Riding PT, DPT, LAT, ATC  02/05/2016  4:03 PM      [facility] [address] [place], [place], [address] Phone: [phone]   Fax:  [phone]  Name: April Merritt MRN: [id number] Date of Birth: [birth year]

## 2016-04-21 ENCOUNTER — Inpatient Hospital Stay (HOSPITAL_COMMUNITY)
Admission: AD | Admit: 2016-04-21 | Discharge: 2016-04-21 | Disposition: A | Discharge status: Home / Self Care | Payer: Medicaid Other | Source: Ambulatory Visit | Attending: Obstetrics & Gynecology | Admitting: Obstetrics & Gynecology

## 2016-04-21 ENCOUNTER — Encounter (HOSPITAL_COMMUNITY): Payer: Self-pay

## 2016-04-21 DIAGNOSIS — O99611 Diseases of the digestive system complicating pregnancy, first trimester: Secondary | ICD-10-CM | POA: Diagnosis not present

## 2016-04-21 DIAGNOSIS — R112 Nausea with vomiting, unspecified: Secondary | ICD-10-CM | POA: Diagnosis not present

## 2016-04-21 DIAGNOSIS — Z3A01 Less than 8 weeks gestation of pregnancy: Secondary | ICD-10-CM | POA: Diagnosis not present

## 2016-04-21 DIAGNOSIS — R102 Pelvic and perineal pain: Secondary | ICD-10-CM | POA: Diagnosis present

## 2016-04-21 DIAGNOSIS — Z36 Encounter for antenatal screening of mother: Secondary | ICD-10-CM | POA: Diagnosis not present

## 2016-04-21 DIAGNOSIS — O26899 Other specified pregnancy related conditions, unspecified trimester: Secondary | ICD-10-CM

## 2016-04-21 DIAGNOSIS — O9989 Other specified diseases and conditions complicating pregnancy, childbirth and the puerperium: Secondary | ICD-10-CM | POA: Diagnosis not present

## 2016-04-21 DIAGNOSIS — K5900 Constipation, unspecified: Secondary | ICD-10-CM

## 2016-04-21 DIAGNOSIS — R109 Unspecified abdominal pain: Secondary | ICD-10-CM | POA: Diagnosis not present

## 2016-04-21 DIAGNOSIS — K5909 Other constipation: Secondary | ICD-10-CM | POA: Diagnosis not present

## 2016-04-21 DIAGNOSIS — O26891 Other specified pregnancy related conditions, first trimester: Secondary | ICD-10-CM | POA: Insufficient documentation

## 2016-04-21 DIAGNOSIS — Z87891 Personal history of nicotine dependence: Secondary | ICD-10-CM | POA: Diagnosis not present

## 2016-04-21 LAB — URINE MICROSCOPIC-ADD ON: RBC / HPF: NONE SEEN RBC/hpf (ref 0–5)

## 2016-04-21 LAB — COMPREHENSIVE METABOLIC PANEL
ALK PHOS: 37 U/L — AB (ref 38–126)
ALT: 18 U/L (ref 14–54)
AST: 20 U/L (ref 15–41)
Albumin: 4.6 g/dL (ref 3.5–5.0)
Anion gap: 9 (ref 5–15)
BILIRUBIN TOTAL: 0.6 mg/dL (ref 0.3–1.2)
BUN: 7 mg/dL (ref 6–20)
CALCIUM: 9.6 mg/dL (ref 8.9–10.3)
CO2: 23 mmol/L (ref 22–32)
CREATININE: 0.57 mg/dL (ref 0.44–1.00)
Chloride: 102 mmol/L (ref 101–111)
Glucose, Bld: 75 mg/dL (ref 65–99)
Potassium: 3.9 mmol/L (ref 3.5–5.1)
Sodium: 134 mmol/L — ABNORMAL LOW (ref 135–145)
Total Protein: 7.5 g/dL (ref 6.5–8.1)

## 2016-04-21 LAB — URINALYSIS, ROUTINE W REFLEX MICROSCOPIC
Bilirubin Urine: NEGATIVE
GLUCOSE, UA: NEGATIVE mg/dL
Hgb urine dipstick: NEGATIVE
Ketones, ur: >80 mg/dL — AB
LEUKOCYTES UA: NEGATIVE
NITRITE: NEGATIVE
Protein, ur: 30 mg/dL — AB
SPECIFIC GRAVITY, URINE: 1.01 (ref 1.005–1.030)
pH: 7 (ref 5.0–8.0)

## 2016-04-21 LAB — WET PREP, GENITAL
Clue Cells Wet Prep HPF POC: NONE SEEN
SPERM: NONE SEEN
Trich, Wet Prep: NONE SEEN
Yeast Wet Prep HPF POC: NONE SEEN

## 2016-04-21 LAB — CBC
HEMATOCRIT: 39.8 % (ref 36.0–46.0)
Hemoglobin: 13.6 g/dL (ref 12.0–15.0)
MCH: 27 pg (ref 26.0–34.0)
MCHC: 34.2 g/dL (ref 30.0–36.0)
MCV: 79 fL (ref 78.0–100.0)
Platelets: 219 10*3/uL (ref 150–400)
RBC: 5.04 MIL/uL (ref 3.87–5.11)
RDW: 13.9 % (ref 11.5–15.5)
WBC: 10.2 10*3/uL (ref 4.0–10.5)

## 2016-04-21 LAB — POCT PREGNANCY, URINE: Preg Test, Ur: POSITIVE — AB

## 2016-04-21 MED ORDER — SODIUM CHLORIDE 0.9 % IV SOLN: Freq: Once | INTRAVENOUS | Status: DC

## 2016-04-21 MED ORDER — DEXTROSE IN LACTATED RINGERS 5 % IV SOLN
INTRAVENOUS | Status: DC
  Administered 2016-04-21: 14:00:00 via INTRAVENOUS

## 2016-04-21 MED ORDER — LACTATED RINGERS IV SOLN
Freq: Once | INTRAVENOUS | Status: AC
  Administered 2016-04-21: 13:00:00 via INTRAVENOUS

## 2016-04-21 MED ORDER — METOCLOPRAMIDE HCL 5 MG/ML IJ SOLN
10.0000 mg | Freq: Once | INTRAMUSCULAR | Status: AC
  Administered 2016-04-21: 10 mg via INTRAVENOUS
  Filled 2016-04-21: qty 2

## 2016-04-21 MED ORDER — POLYETHYLENE GLYCOL 3350 17 G PO PACK: 17.0000 g | PACK | Freq: Every day | ORAL | 0 refills | Status: DC

## 2016-04-21 NOTE — MAU Note (Signed)
Has had increasing nausea and vomiting. Pelvic pain that also feels like the pain is in her bladder. She stated she felt like she was going to black out today.

## 2016-04-21 NOTE — Discharge Instructions (Signed)
Abdominal Pain During Pregnancy °Abdominal pain is common in pregnancy. Most of the time, it does not cause harm. There are many causes of abdominal pain. Some causes are more serious than others. Some of the causes of abdominal pain in pregnancy are easily diagnosed. Occasionally, the diagnosis takes time to understand. Other times, the cause is not determined. Abdominal pain can be a sign that something is very wrong with the pregnancy, or the pain may have nothing to do with the pregnancy at all. For this reason, always tell your health care provider if you have any abdominal discomfort. °HOME CARE INSTRUCTIONS  °Monitor your abdominal pain for any changes. The following actions may help to alleviate any discomfort you are experiencing: °· Do not have sexual intercourse or put anything in your vagina until your symptoms go away completely. °· Get plenty of rest until your pain improves. °· Drink clear fluids if you feel nauseous. Avoid solid food as long as you are uncomfortable or nauseous. °· Only take over-the-counter or prescription medicine as directed by your health care provider. °· Keep all follow-up appointments with your health care provider. °SEEK IMMEDIATE MEDICAL CARE IF: °· You are bleeding, leaking fluid, or passing tissue from the vagina. °· You have increasing pain or cramping. °· You have persistent vomiting. °· You have painful or bloody urination. °· You have a fever. °· You notice a decrease in your baby's movements. °· You have extreme weakness or feel faint. °· You have shortness of breath, with or without abdominal pain. °· You develop a severe headache with abdominal pain. °· You have abnormal vaginal discharge with abdominal pain. °· You have persistent diarrhea. °· You have abdominal pain that continues even after rest, or gets worse. °MAKE SURE YOU:  °· Understand these instructions. °· Will watch your condition. °· Will get help right away if you are not doing well or get worse. °    °This information is not intended to replace advice given to you by your health care provider. Make sure you discuss any questions you have with your health care provider. °  °Document Released: 09/14/2005 Document Revised: 07/05/2013 Document Reviewed: 04/13/2013 °Elsevier Interactive Patient Education ©2016 [employer] °Constipation, Adult °Constipation is when a person has fewer than three bowel movements a week, has difficulty having a bowel movement, or has stools that are dry, hard, or larger than normal. As people grow older, constipation is more common. A low-fiber diet, not taking in enough fluids, and taking certain medicines may make constipation worse.  °CAUSES  °· Certain medicines, such as antidepressants, pain medicine, iron supplements, antacids, and water pills.   °· Certain diseases, such as diabetes, irritable bowel syndrome (IBS), thyroid disease, or depression.   °· Not drinking enough water.   °· Not eating enough fiber-rich foods.   °· Stress or travel.   °· Lack of physical activity or exercise.   °· Ignoring the urge to have a bowel movement.   °· Using laxatives too much.   °SIGNS AND SYMPTOMS  °· Having fewer than three bowel movements a week.   °· Straining to have a bowel movement.   °· Having stools that are hard, dry, or larger than normal.   °· Feeling full or bloated.   °· Pain in the lower abdomen.   °· Not feeling relief after having a bowel movement.   °DIAGNOSIS  °Your health care provider will take a medical history and perform a physical exam. Further testing may be done for severe constipation. Some tests may include: °· A barium enema X-ray to   examine your rectum, colon, and, sometimes, your small intestine.   °· A sigmoidoscopy to examine your lower colon.   °· A colonoscopy to examine your entire colon. °TREATMENT  °Treatment will depend on the severity of your constipation and what is causing it. Some dietary treatments include drinking more fluids and eating more  fiber-rich foods. Lifestyle treatments may include regular exercise. If these diet and lifestyle recommendations do not help, your health care provider may recommend taking over-the-counter laxative medicines to help you have bowel movements. Prescription medicines may be prescribed if over-the-counter medicines do not work.  °HOME CARE INSTRUCTIONS  °· Eat foods that have a lot of fiber, such as fruits, vegetables, whole grains, and beans. °· Limit foods high in fat and processed sugars, such as french fries, hamburgers, cookies, candies, and soda.   °· A fiber supplement may be added to your diet if you cannot get enough fiber from foods.   °· Drink enough fluids to keep your urine clear or pale yellow.   °· Exercise regularly or as directed by your health care provider.   °· Go to the restroom when you have the urge to go. Do not hold it.   °· Only take over-the-counter or prescription medicines as directed by your health care provider. Do not take other medicines for constipation without talking to your health care provider first.   °SEEK IMMEDIATE MEDICAL CARE IF:  °· You have bright red blood in your stool.   °· Your constipation lasts for more than 4 days or gets worse.   °· You have abdominal or rectal pain.   °· You have thin, pencil-like stools.   °· You have unexplained weight loss. °MAKE SURE YOU:  °· Understand these instructions. °· Will watch your condition. °· Will get help right away if you are not doing well or get worse. °  °This information is not intended to replace advice given to you by your health care provider. Make sure you discuss any questions you have with your health care provider. °  °Document Released: 06/12/2004 Document Revised: 10/05/2014 Document Reviewed: 06/26/2013 °Elsevier Interactive Patient Education ©2016 [employer] Inc. ° °

## 2016-04-21 NOTE — MAU Provider Note (Signed)
Chief Complaint: Nausea; Morning Sickness; and Pelvic Pain   First Provider Initiated Contact with Patient 04/21/16 1154        SUBJECTIVE April Merritt is a 25 y.o. G2P0101 at [redacted]w[redacted]d by LMP and [redacted]w[redacted]d by bedside US who presents to maternity admissions reporting nausea, vomiting and abdominal pain.  States it has been since the begiinning of her pregnancy..Pain is all over her abdomen.  States has constipation but has a bowel movement every 2-3 days.  States has never been regular with BMs.   She denies vaginal bleeding, vaginal itching/burning, urinary symptoms, h/a, dizziness, n/v, or fever/chills.     Pelvic Pain  The patient's primary symptoms include pelvic pain. This is a new problem. The current episode started 1 to 4 weeks ago. The problem occurs constantly. The problem has been unchanged. The pain is mild. The problem affects both sides. She is pregnant. Associated symptoms include abdominal pain, chills, constipation, nausea and vomiting. Pertinent negatives include no back pain, diarrhea, dysuria, fever or frequency. The symptoms are aggravated by tactile pressure. She has tried nothing for the symptoms. It is unknown whether or not her partner has an STD.    RN Note: Has had increasing nausea and vomiting. Pelvic pain that also feels like the pain is in her bladder. She stated she felt like she was going to black out today   Past Medical History:  Diagnosis Date  . Anxiety   . Depression   . H/O varicella   . Headache(784.0)    hx of migraines  . Mastitis 10/01/11  . Multiple food allergies    Past Surgical History:  Procedure Laterality Date  . WISDOM TOOTH EXTRACTION  2011   Social History   Social History  . Marital status: Single    Spouse name: N/A  . Number of children: N/A  . Years of education: N/A   Occupational History  . Not on file.   Social History Main Topics  . Smoking status: Former Smoker    Packs/day: 0.25    Types: Cigarettes    Quit date:  12/28/2015  . Smokeless tobacco: Never Used  . Alcohol use Yes  . Drug use: No  . Sexual activity: Yes    Partners: Male    Birth control/ protection: None   Other Topics Concern  . Not on file   Social History Narrative  . No narrative on file   No current facility-administered medications on file prior to encounter.    Current Outpatient Prescriptions on File Prior to Encounter  Medication Sig Dispense Refill  . norethindrone-ethinyl estradiol (JUNEL FE,GILDESS FE,LOESTRIN FE) 1-20 MG-MCG tablet Take 1 tablet by mouth daily. Reported on 02/05/2016     No Known Allergies  I have reviewed patient's Past Medical Hx, Surgical Hx, Family Hx, Social Hx, medications and allergies.   ROS:  Review of Systems  Constitutional: Positive for chills. Negative for fever.  Gastrointestinal: Positive for abdominal pain, constipation, nausea and vomiting. Negative for diarrhea.  Genitourinary: Positive for pelvic pain. Negative for dysuria and frequency.  Musculoskeletal: Negative for back pain.    Other systems negative  Physical Exam  Patient Vitals for the past 24 hrs:  BP Temp Temp src Pulse Resp Height Weight  04/21/16 1139 120/70 99.5 F (37.5 C) Oral 88 18 - -  04/21/16 1135 - - - - - 5\' 1"  (1.549 m) 68.1 kg (150 lb 3.2 oz)   Physical Exam  Constitutional: Well-developed female in no acute distress but  appears to have malaise  Cardiovascular: normal rate and rhythm Respiratory: normal effort, lungs CTAB GI: Abd soft, mildly to moderately tender throughout all 4 quadrants.  No rebound or guarding. Pos BS x 4 MS: Extremities nontender, no edema, normal ROM Neurologic: Alert and oriented x 4.  GU: Neg CVAT.  PELVIC EXAM: normal for GA   LAB RESULTS Results for orders placed or performed during the hospital encounter of 04/21/16 (from the past 24 hour(s))  Urinalysis, Routine w reflex microscopic (not at [facility])     Status: Abnormal   Collection Time: 04/21/16 11:35 AM  Result  Value Ref Range   Color, Urine YELLOW YELLOW   APPearance CLOUDY (A) CLEAR   Specific Gravity, Urine 1.010 1.005 - 1.030   pH 7.0 5.0 - 8.0   Glucose, UA NEGATIVE NEGATIVE mg/dL   Hgb urine dipstick NEGATIVE NEGATIVE   Bilirubin Urine NEGATIVE NEGATIVE   Ketones, ur >80 (A) NEGATIVE mg/dL   Protein, ur 30 (A) NEGATIVE mg/dL   Nitrite NEGATIVE NEGATIVE   Leukocytes, UA NEGATIVE NEGATIVE  Urine microscopic-add on     Status: Abnormal   Collection Time: 04/21/16 11:35 AM  Result Value Ref Range   Squamous Epithelial / LPF 6-30 (A) NONE SEEN   WBC, UA 0-5 0 - 5 WBC/hpf   RBC / HPF NONE SEEN 0 - 5 RBC/hpf   Bacteria, UA FEW (A) NONE SEEN   Urine-Other MUCOUS PRESENT   Pregnancy, urine POC     Status: Abnormal   Collection Time: 04/21/16 11:49 AM  Result Value Ref Range   Preg Test, Ur POSITIVE (A) NEGATIVE  CBC     Status: None   Collection Time: 04/21/16 12:40 PM  Result Value Ref Range   WBC 10.2 4.0 - 10.5 K/uL   RBC 5.04 3.87 - 5.11 MIL/uL   Hemoglobin 13.6 12.0 - 15.0 g/dL   HCT 16.1 09.6 - 04.5 %   MCV 79.0 78.0 - 100.0 fL   MCH 27.0 26.0 - 34.0 pg   MCHC 34.2 30.0 - 36.0 g/dL   RDW 40.9 81.1 - 91.4 %   Platelets 219 150 - 400 K/uL  Comprehensive metabolic panel     Status: Abnormal   Collection Time: 04/21/16 12:40 PM  Result Value Ref Range   Sodium 134 (L) 135 - 145 mmol/L   Potassium 3.9 3.5 - 5.1 mmol/L   Chloride 102 101 - 111 mmol/L   CO2 23 22 - 32 mmol/L   Glucose, Bld 75 65 - 99 mg/dL   BUN 7 6 - 20 mg/dL   Creatinine, Ser 7.82 0.44 - 1.00 mg/dL   Calcium 9.6 8.9 - 95.6 mg/dL   Total Protein 7.5 6.5 - 8.1 g/dL   Albumin 4.6 3.5 - 5.0 g/dL   AST 20 15 - 41 U/L   ALT 18 14 - 54 U/L   Alkaline Phosphatase 37 (L) 38 - 126 U/L   Total Bilirubin 0.6 0.3 - 1.2 mg/dL   GFR calc non Af Amer >60 >60 mL/min   GFR calc Af Amer >60 >60 mL/min   Anion gap 9 5 - 15  Wet prep, genital     Status: Abnormal   Collection Time: 04/21/16 12:40 PM  Result Value Ref  Range   Yeast Wet Prep HPF POC NONE SEEN NONE SEEN   Trich, Wet Prep NONE SEEN NONE SEEN   Clue Cells Wet Prep HPF POC NONE SEEN NONE SEEN   WBC, Wet Prep HPF POC  FEW (A) NONE SEEN   Sperm NONE SEEN        IMAGING No results found.  MAU Management/MDM: Ordered CBC and CMET to evaluate for non-obstetric causes of abdominal pain  >> normal Bedside US done to evaluate pregnancy location and gestational age.                     Single IUP seen                      Fetus active                      CRL consistent with [redacted]w[redacted]d                      Yolk sac visible                      FHR 160 Pelvic exam and cultures done Treatments in MAU included IV hydration. >>  Able to tolerate PO fluids after hyration  This pain can represent a normal pregnancy with bleeding, spontaneous abortion or even an ectopic which can be life-threatening.  The process as listed above helps to determine which of these is present.    ASSESSMENT SIUP at [redacted]w[redacted]d  Abdominal pain in pregnancy, uncertain etiology No evidence of acute abdominal process Viable pregnancy Constipation, chronic, may be partially responsible for some of the pain  PLAN Discharge home Advance diet as tolerated Rx Miralax for constipation Follow up with clinic for prenatal care    Medication List    ASK your doctor about these medications   norethindrone-ethinyl estradiol 1-20 MG-MCG tablet Commonly known as:  JUNEL FE,GILDESS FE,LOESTRIN FE Take 1 tablet by mouth daily. Reported on 02/05/2016       Pt stable at time of discharge. Encouraged to return here or to other Urgent Care/ED if she develops worsening of symptoms, increase in pain, fever, or other concerning symptoms.    Wynelle Bourgeois CNM, MSN Certified Nurse-Midwife 04/21/2016  12:24 PM

## 2016-04-22 LAB — GC/CHLAMYDIA PROBE AMP (~~LOC~~) NOT AT ARMC
Chlamydia: NEGATIVE
Neisseria Gonorrhea: NEGATIVE

## 2016-04-23 LAB — OB RESULTS CONSOLE ABO/RH: RH Type: POSITIVE

## 2016-04-23 LAB — OB RESULTS CONSOLE RPR: RPR: NONREACTIVE

## 2016-04-23 LAB — OB RESULTS CONSOLE ANTIBODY SCREEN: Antibody Screen: NEGATIVE

## 2016-04-23 LAB — OB RESULTS CONSOLE RUBELLA ANTIBODY, IGM: Rubella: IMMUNE

## 2016-04-23 LAB — OB RESULTS CONSOLE HEPATITIS B SURFACE ANTIGEN: Hepatitis B Surface Ag: NEGATIVE

## 2016-04-23 LAB — OB RESULTS CONSOLE HIV ANTIBODY (ROUTINE TESTING): HIV: NONREACTIVE

## 2016-05-20 IMAGING — CR DG LUMBAR SPINE 2-3V
3 series · 3 of 3 positions shown · non-contrast
Comparison: 08/18/2004

CLINICAL DATA: Chronic low back pain for years question worsening,
occasional radiation of pain to legs, smoker

EXAM:
LUMBAR SPINE - 2-3 VIEW

[w lumbar spine ap]
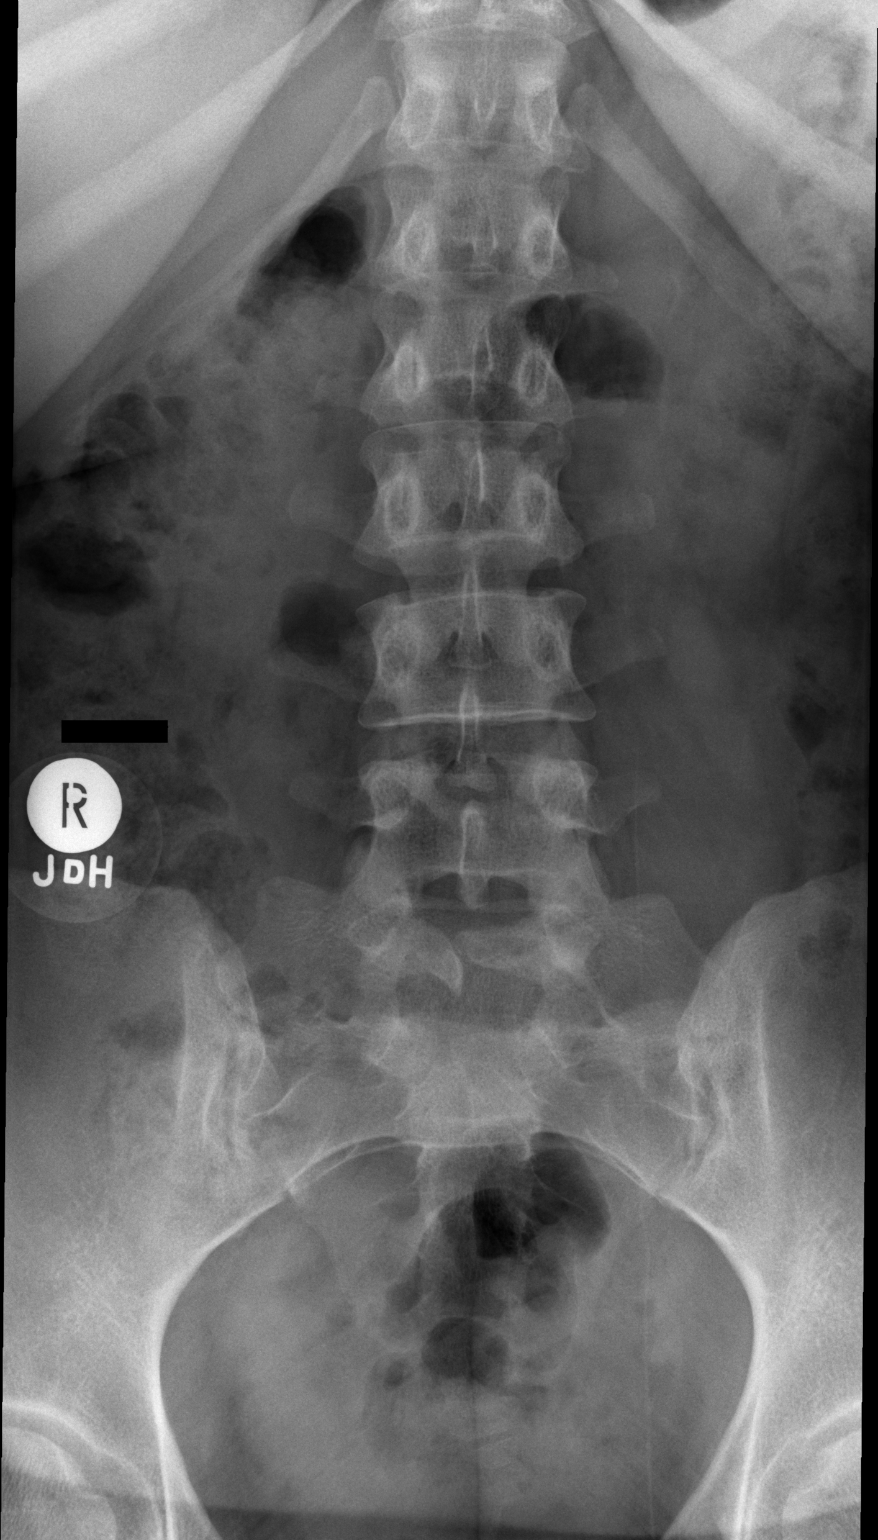

[w lumbar spine lat]
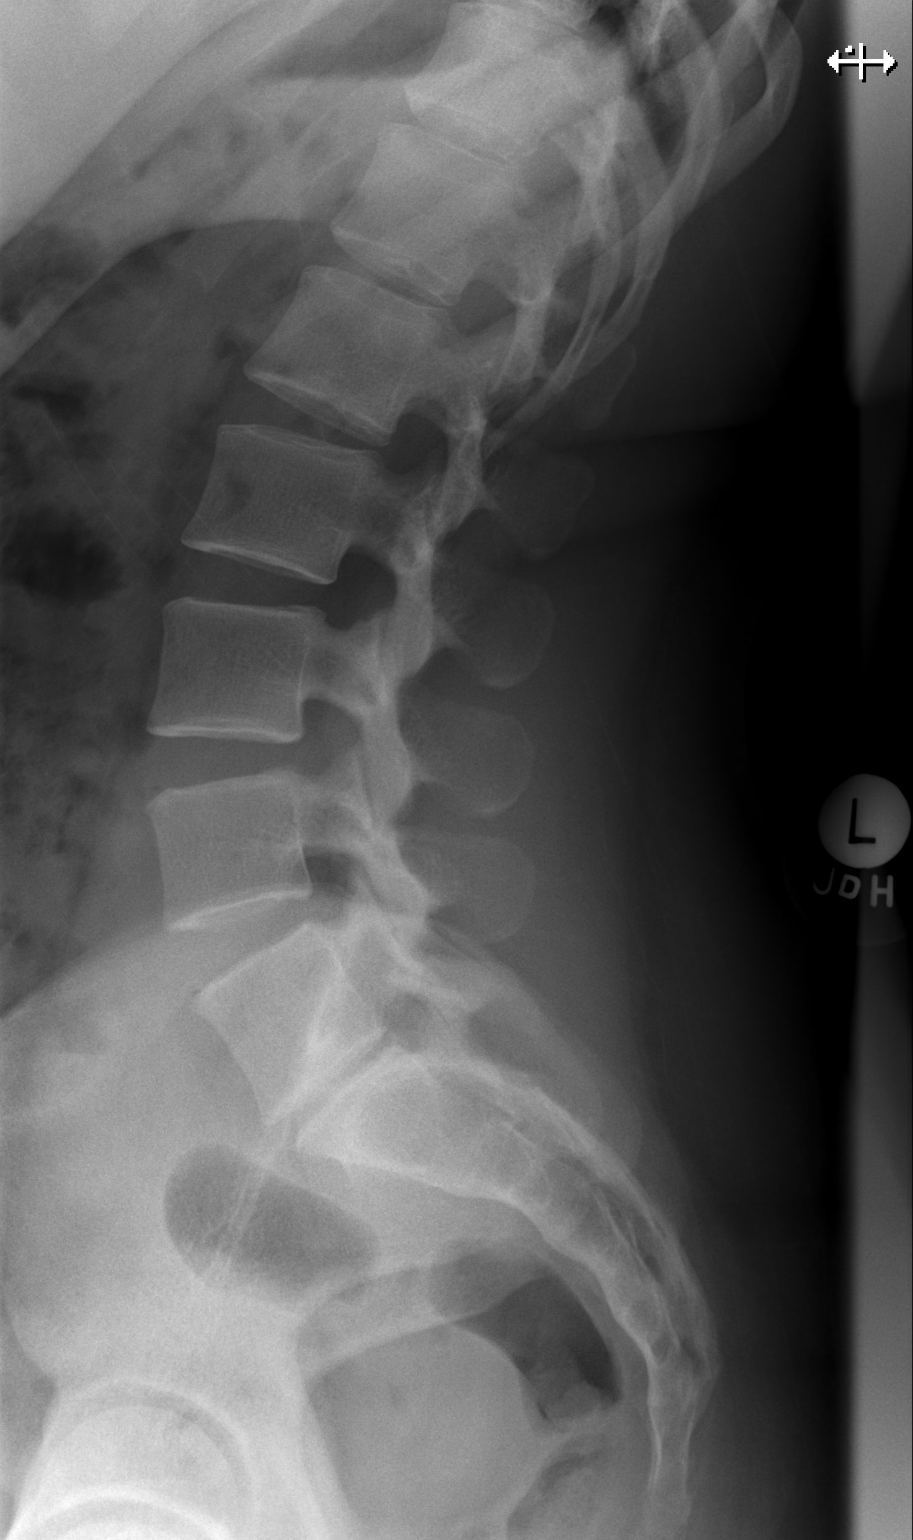

[w lumbar l-5 s-1 spot]
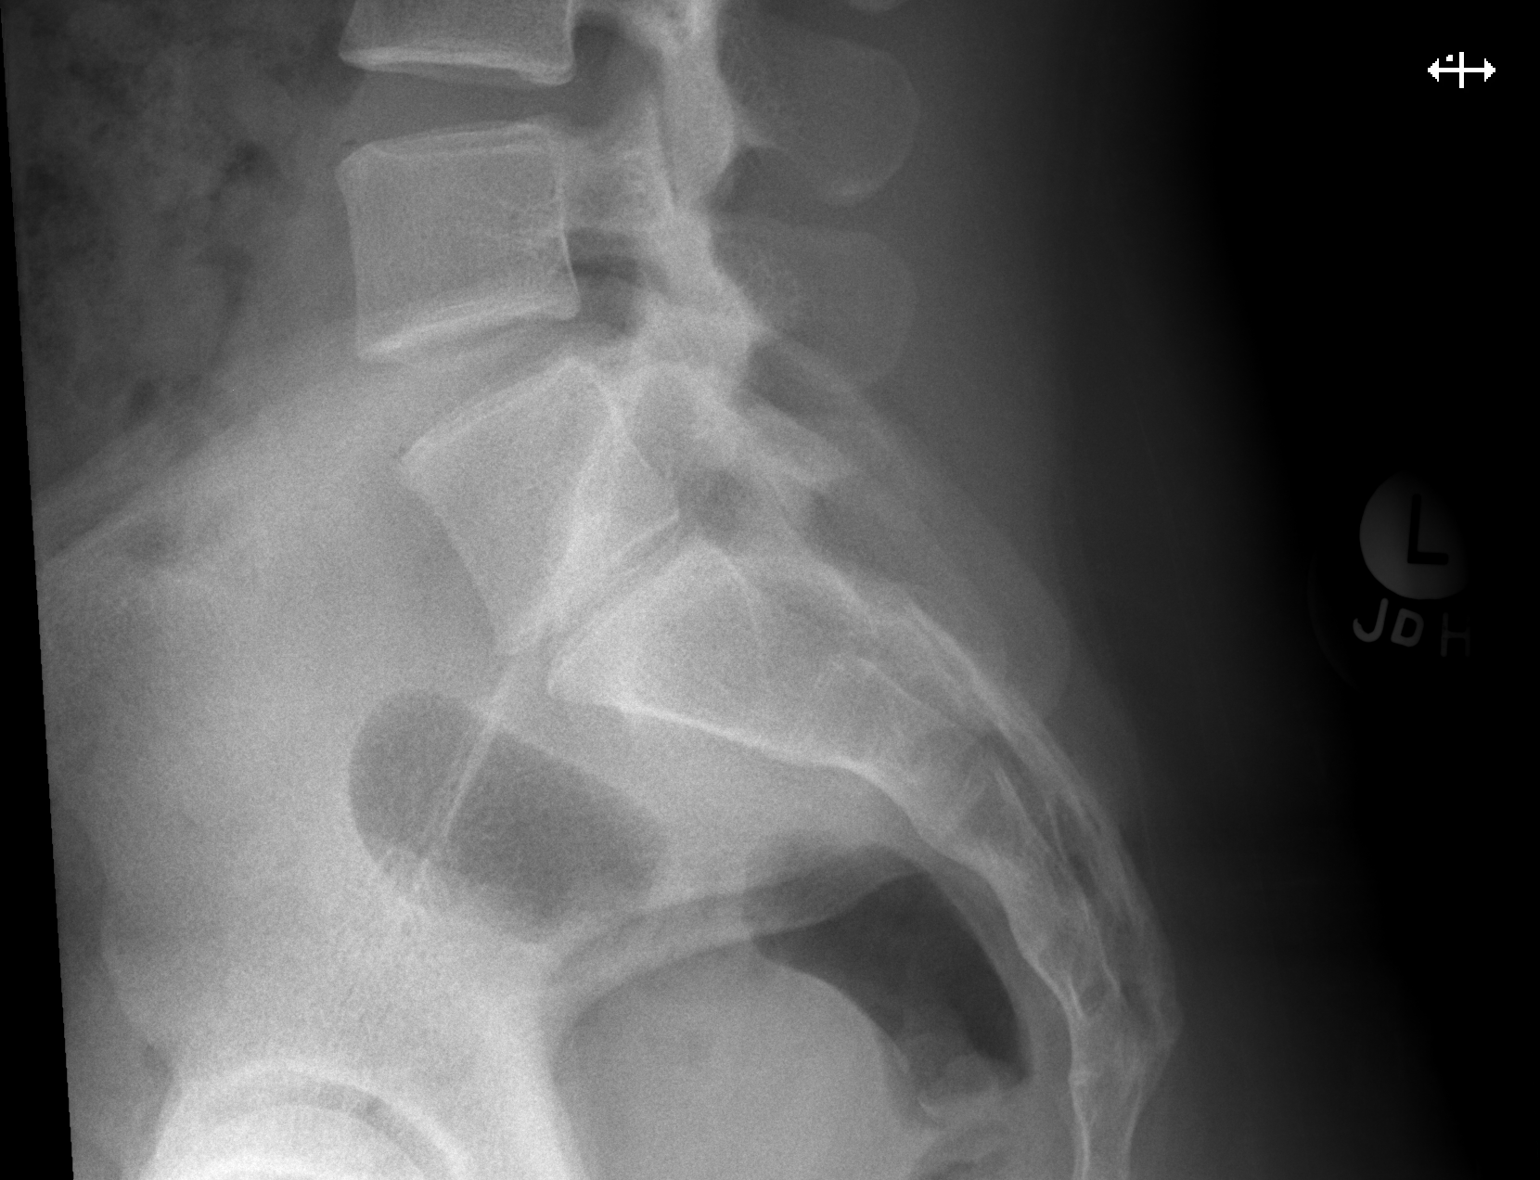

[3 of 3 positions shown; findings below may reference images not displayed]

FINDINGS: Six non-rib-bearing lumbar type segments.

Vertebral body heights maintained.

Disc space narrowing at lumbosacral junction.

Osseous mineralization normal.

No acute fracture, subluxation or bone destruction.

No gross spondylolysis.

SI joints preserved.
IMPRESSION: No acute abnormalities.

## 2016-06-02 ENCOUNTER — Encounter (HOSPITAL_COMMUNITY): Payer: Self-pay | Admitting: *Deleted

## 2016-06-02 ENCOUNTER — Inpatient Hospital Stay (HOSPITAL_COMMUNITY)
Admission: AD | Admit: 2016-06-02 | Discharge: 2016-06-02 | Disposition: A | Discharge status: Home / Self Care | Payer: Medicaid Other | Source: Ambulatory Visit | Attending: Family Medicine | Admitting: Family Medicine

## 2016-06-02 DIAGNOSIS — O99282 Endocrine, nutritional and metabolic diseases complicating pregnancy, second trimester: Secondary | ICD-10-CM | POA: Diagnosis not present

## 2016-06-02 DIAGNOSIS — Z8249 Family history of ischemic heart disease and other diseases of the circulatory system: Secondary | ICD-10-CM | POA: Diagnosis not present

## 2016-06-02 DIAGNOSIS — Z825 Family history of asthma and other chronic lower respiratory diseases: Secondary | ICD-10-CM | POA: Insufficient documentation

## 2016-06-02 DIAGNOSIS — O26892 Other specified pregnancy related conditions, second trimester: Secondary | ICD-10-CM | POA: Insufficient documentation

## 2016-06-02 DIAGNOSIS — R55 Syncope and collapse: Secondary | ICD-10-CM | POA: Diagnosis present

## 2016-06-02 DIAGNOSIS — Z3A16 16 weeks gestation of pregnancy: Secondary | ICD-10-CM | POA: Diagnosis not present

## 2016-06-02 DIAGNOSIS — Z87891 Personal history of nicotine dependence: Secondary | ICD-10-CM | POA: Diagnosis not present

## 2016-06-02 DIAGNOSIS — O219 Vomiting of pregnancy, unspecified: Secondary | ICD-10-CM

## 2016-06-02 DIAGNOSIS — E860 Dehydration: Secondary | ICD-10-CM | POA: Diagnosis not present

## 2016-06-02 LAB — URINALYSIS, ROUTINE W REFLEX MICROSCOPIC
Bilirubin Urine: NEGATIVE
Glucose, UA: NEGATIVE mg/dL
Hgb urine dipstick: NEGATIVE
LEUKOCYTES UA: NEGATIVE
Nitrite: NEGATIVE
PROTEIN: NEGATIVE mg/dL
Specific Gravity, Urine: >1.03 — ABNORMAL HIGH (ref 1.005–1.030)
pH: 6 (ref 5.0–8.0)

## 2016-06-02 LAB — CBC
HEMATOCRIT: 35.1 % — AB (ref 36.0–46.0)
HEMOGLOBIN: 11.9 g/dL — AB (ref 12.0–15.0)
MCH: 27 pg (ref 26.0–34.0)
MCHC: 33.9 g/dL (ref 30.0–36.0)
MCV: 79.8 fL (ref 78.0–100.0)
Platelets: 181 10*3/uL (ref 150–400)
RBC: 4.4 MIL/uL (ref 3.87–5.11)
RDW: 13.5 % (ref 11.5–15.5)
WBC: 11 10*3/uL — AB (ref 4.0–10.5)

## 2016-06-02 MED ORDER — PROMETHAZINE HCL 12.5 MG PO TABS: 12.5000 mg | ORAL_TABLET | Freq: Four times a day (QID) | ORAL | 0 refills | Status: DC | PRN

## 2016-06-02 MED ORDER — ONDANSETRON 4 MG PO TBDP
4.0000 mg | ORAL_TABLET | Freq: Once | ORAL | Status: AC
  Administered 2016-06-02: 4 mg via ORAL
  Filled 2016-06-02: qty 1

## 2016-06-02 MED ORDER — ONDANSETRON 4 MG PO TBDP: 4.0000 mg | ORAL_TABLET | Freq: Three times a day (TID) | ORAL | 0 refills | Status: DC | PRN

## 2016-06-02 NOTE — Discharge Instructions (Signed)
Eating Plan for Hyperemesis Gravidarum °Severe cases of hyperemesis gravidarum can lead to dehydration and malnutrition. The hyperemesis eating plan is one way to lessen the symptoms of nausea and vomiting. It is often used with prescribed medicines to control your symptoms.  °WHAT CAN I DO TO RELIEVE MY SYMPTOMS? °Listen to your body. Everyone is different and has different preferences. Find what works best for you. Some of the following things may help: °· Eat and drink slowly. °· Eat 5-6 small meals daily instead of 3 large meals.   °· Eat crackers before you get out of bed in the morning.   °· Starchy foods are usually well tolerated (such as cereal, toast, bread, potatoes, pasta, rice, and pretzels).   °· Ginger may help with nausea. Add ¼ tsp ground ginger to hot tea or choose ginger tea.   °· Try drinking 100% fruit juice or an electrolyte drink. °· Continue to take your prenatal vitamins as directed by your health care provider. If you are having trouble taking your prenatal vitamins, talk with your health care provider about different options. °· Include at least 1 serving of protein with your meals and snacks (such as meats or poultry, beans, nuts, eggs, or yogurt). Try eating a protein-rich snack before bed (such as cheese and crackers or a half turkey or peanut butter sandwich). °WHAT THINGS SHOULD I AVOID TO REDUCE MY SYMPTOMS? °The following things may help reduce your symptoms: °· Avoid foods with strong smells. Try eating meals in well-ventilated areas that are free of odors. °· Avoid drinking water or other beverages with meals. Try not to drink anything less than 30 minutes before and after meals. °· Avoid drinking more than 1 cup of fluid at a time. °· Avoid fried or high-fat foods, such as butter and cream sauces. °· Avoid spicy foods. °· Avoid skipping meals the best you can. Nausea can be more intense on an empty stomach. If you cannot tolerate food at that time, do not force it. Try sucking on  ice chips or other frozen items and make up the calories later. °· Avoid lying down within 2 hours after eating. °  °This information is not intended to replace advice given to you by your health care provider. Make sure you discuss any questions you have with your health care provider. °  °Document Released: 07/12/2007 Document Revised: 09/19/2013 Document Reviewed: 07/19/2013 °Elsevier Interactive Patient Education ©2016 [employer] ° °

## 2016-06-02 NOTE — MAU Note (Signed)
Pt reports blacking out in [employer] two days ago but did not fall.  She has had "episodes of darkness". She also has tingling in her legs that started yesterday.  Denies LOF/VB/cramping.

## 2016-06-02 NOTE — MAU Provider Note (Signed)
History     CSN: [id number]  Arrival date and time: 06/02/16 1512   First Provider Initiated Contact with Patient 06/02/16 1646         Chief Complaint  Patient presents with  . Loss of Consciousness  . Morning Sickness   HPI  April Merritt is a 25 y.o. G2P0101 at [redacted]w[redacted]d who presents with nausea, dizziness, & syncope. Pt reports intermittent dizziness for the last several days. Reports a syncopal episode on Saturday that lasted for 2 minutes per witness. Did not fall down or hit head per patient.  Reports episodes of dizziness after standing for long periods of time. Some nausea & vomiting during the pregnancy. Is not taking medication for nausea. Has had 4 peanut butter crackers today & "a little bit" of water. States she doesn't eat very much b/c she hasn't been hungry.  Denies abdominal pain, vaginal bleeding, chest pain, SOB, palpitations, or LOF.  Goes to [facility] for prenatal care.   OB History    Gravida Para Term Preterm AB Living   2 1 0 1 0 1   SAB TAB Ectopic Multiple Live Births   0 0 0 0 1      Past Medical History:  Diagnosis Date  . Anxiety   . Depression   . H/O varicella   . Headache(784.0)    hx of migraines  . Mastitis 10/01/11  . Multiple food allergies     Past Surgical History:  Procedure Laterality Date  . WISDOM TOOTH EXTRACTION  2011    Family History  Problem Relation Age of Onset  . Alcohol abuse Father   . Asthma Father   . Heart disease Father   . Heart attack Father   . Cancer Maternal Grandfather   . Cancer Maternal Aunt   . Anesthesia problems Neg Hx     Social History  Substance Use Topics  . Smoking status: Former Smoker    Packs/day: 0.25    Types: Cigarettes    Quit date: 12/28/2015  . Smokeless tobacco: Never Used  . Alcohol use Yes    Allergies: No Known Allergies  Prescriptions Prior to Admission  Medication Sig Dispense Refill Last Dose  . Prenatal Vit-Fe Fumarate-FA (PRENATAL MULTIVITAMIN) TABS tablet Take 1  tablet by mouth daily at 12 noon.   06/02/2016 at Unknown time  . polyethylene glycol (MIRALAX / GLYCOLAX) packet Take 17 g by mouth daily. (Patient not taking: Reported on 06/02/2016) 14 each 0 Not Taking at Unknown time    Review of Systems  Constitutional: Negative.   Respiratory: Negative.   Cardiovascular: Negative.   Gastrointestinal: Positive for nausea and vomiting. Negative for abdominal pain, constipation and diarrhea.  Genitourinary: Negative.   Neurological: Positive for dizziness and loss of consciousness. Negative for headaches.   Physical Exam   Blood pressure 115/63, pulse 72, temperature 98.3 F (36.8 C), temperature source Oral, resp. rate 16, last menstrual period 01/14/2016, SpO2 99 %.  Physical Exam  Nursing note and vitals reviewed. Constitutional: She is oriented to person, place, and time. She appears well-developed and well-nourished. No distress.  HENT:  Head: Normocephalic and atraumatic.  Eyes: Conjunctivae are normal. Right eye exhibits no discharge. Left eye exhibits no discharge. No scleral icterus.  Neck: Normal range of motion.  Cardiovascular: Normal rate, regular rhythm and normal heart sounds.   No murmur heard. Respiratory: Effort normal and breath sounds normal. No respiratory distress. She has no wheezes.  GI: Soft. There is no tenderness.  Neurological: She is alert and oriented to person, place, and time.  Skin: Skin is warm and dry. She is not diaphoretic.  Psychiatric: She has a normal mood and affect. Her behavior is normal. Judgment and thought content normal.    MAU Course  Procedures Results for orders placed or performed during the hospital encounter of 06/02/16 (from the past 24 hour(s))  CBC     Status: Abnormal   Collection Time: 06/02/16  3:48 PM  Result Value Ref Range   WBC 11.0 (H) 4.0 - 10.5 K/uL   RBC 4.40 3.87 - 5.11 MIL/uL   Hemoglobin 11.9 (L) 12.0 - 15.0 g/dL   HCT 16.135.1 (L) 09.636.0 - 04.546.0 %   MCV 79.8 78.0 - 100.0 fL    MCH 27.0 26.0 - 34.0 pg   MCHC 33.9 30.0 - 36.0 g/dL   RDW 40.913.5 81.111.5 - 91.415.5 %   Platelets 181 150 - 400 K/uL  Urinalysis, Routine w reflex microscopic (not at [facility])     Status: Abnormal   Collection Time: 06/02/16  3:50 PM  Result Value Ref Range   Color, Urine YELLOW YELLOW   APPearance CLEAR CLEAR   Specific Gravity, Urine >1.030 (H) 1.005 - 1.030   pH 6.0 5.0 - 8.0   Glucose, UA NEGATIVE NEGATIVE mg/dL   Hgb urine dipstick NEGATIVE NEGATIVE   Bilirubin Urine NEGATIVE NEGATIVE   Ketones, ur >80 (A) NEGATIVE mg/dL   Protein, ur NEGATIVE NEGATIVE mg/dL   Nitrite NEGATIVE NEGATIVE   Leukocytes, UA NEGATIVE NEGATIVE   MDM VSS, NAD EKG normal Hemoglobin normal U/a >80 ketones & >1.030 SG Pt declines IV fluids at this time PO hydration & zofran odt  No vomiting while in MAU & pt asymptomatic  Assessment and Plan  A: 1. Dehydration   2. Nausea and vomiting during pregnancy prior to [redacted] weeks gestation   3. Syncope, near     P: Discharge home Rx zofran & phenergan Discussed hydration states & eating every 3 hours to maintain BS Keep f/u with OB Discussed reasons to return to MAU  Judeth HornErin Alexandro Line 06/02/2016, 4:46 PM

## 2016-09-28 NOTE — L&D Delivery Note (Signed)
Delivery Note Patient began feeling a lot of rectal pressure at 1315, cervix found to be 5cm   Doula was arriving shortly with birth tub.  Inquired as to preps made since no consent or class certificate was seen in Media tab.  Discussed we would need to see the certificate and then sign consent papers in order to do a waterbirth.  I understood the patient to say they had to "just go get the certificate" and it seemed to imply that it was in their car.  Later on I was informed the FOB had gone to their house to get it.  Unclear as to who told him to do that.  I did not. Patient requested Fentanyl IV for pain.     She later started of c/o having to have a BM and insisted on going to bathroom.  I checked her again and found her cervix to be 6-7cm at 1329.  Patient allowed to go to bathroom but urged her not to bear down much at all.     A few minutes later, pressure became stronger and fetal vertex was palpated at the perineum, so patient moved to bed, where she assumed hands/knees position.  After several pushes the head emerged slowly.  Attempted to deliver anterior shoulder with no success.  I asked the nurses to mark time and get assistance. Attempted delivery of posterior shoulder as she was already in [place] position.  Unable to get my hand to axilla. Patient turned over to left side then back so that we could utilize Suprapubic pressure.  With that and my ability to now grasp anterior (right ) axilla, we accomplished delivery of shoulders.  Baby dried and stimulated and began to cry.  Total time was about 55 seconds per RN report.  At 1:42 PM a viable and healthy female was delivered via  (Presentation: LOA ).  APGAR: , ; weight  .   Placenta status: Spontaneous and grossly intact with 3 vessel Cord:  with the following complications: none    Anesthesia:  none Episiotomy:  none Lacerations:  none Suture Repair: none Est. Blood Loss (mL):  150  Mom to postpartum.  Baby to Couplet care / Skin to  Skin   Informed patient that we were not able to get ampicillin in so that baby will not be able to go home tonight or in am probably, will need to let Peds direct timing of discharge.  After delivery, FOB arrived and was very upset over missing the birth.  Screamed obscenities and would not listen to anyone  Stated to patient's mother "I know you set me up and I want you dead".  Security called, FOB ran out of room. He returned later, calmer.   Wynelle BourgeoisMarie Anysia Choi 11/05/2016, 2:27 PM

## 2016-10-09 LAB — OB RESULTS CONSOLE GBS: STREP GROUP B AG: POSITIVE

## 2016-10-09 LAB — OB RESULTS CONSOLE GC/CHLAMYDIA
CHLAMYDIA, DNA PROBE: NEGATIVE
GC PROBE AMP, GENITAL: NEGATIVE

## 2016-11-01 ENCOUNTER — Encounter (HOSPITAL_COMMUNITY): Payer: Self-pay | Admitting: *Deleted

## 2016-11-01 ENCOUNTER — Inpatient Hospital Stay (HOSPITAL_COMMUNITY)
Admission: AD | Admit: 2016-11-01 | Discharge: 2016-11-01 | Disposition: A | Discharge status: Home / Self Care | Payer: Medicaid Other | Source: Ambulatory Visit | Attending: Obstetrics & Gynecology | Admitting: Obstetrics & Gynecology

## 2016-11-01 DIAGNOSIS — O26893 Other specified pregnancy related conditions, third trimester: Secondary | ICD-10-CM | POA: Insufficient documentation

## 2016-11-01 DIAGNOSIS — Z3A38 38 weeks gestation of pregnancy: Secondary | ICD-10-CM | POA: Insufficient documentation

## 2016-11-01 DIAGNOSIS — R109 Unspecified abdominal pain: Secondary | ICD-10-CM | POA: Insufficient documentation

## 2016-11-01 MED ORDER — ONDANSETRON 4 MG PO TBDP: 4.0000 mg | ORAL_TABLET | Freq: Three times a day (TID) | ORAL | 4 refills | Status: DC | PRN

## 2016-11-01 NOTE — MAU Note (Signed)
Pt presents abd pain that does not feel like contractions. States the pain has been happening since yesterday and is constant. Denies bleeding or ROM

## 2016-11-01 NOTE — Discharge Instructions (Signed)
Keep your scheduled appointment tomorrow.  Return here as needed.

## 2016-11-05 ENCOUNTER — Encounter (HOSPITAL_COMMUNITY): Payer: Self-pay | Admitting: *Deleted

## 2016-11-05 ENCOUNTER — Inpatient Hospital Stay (HOSPITAL_COMMUNITY)
Admission: AD | Admit: 2016-11-05 | Discharge: 2016-11-07 | DRG: 775 | Disposition: A | Discharge status: Home / Self Care | Payer: Medicaid Other | Source: Ambulatory Visit | Attending: Obstetrics and Gynecology | Admitting: Obstetrics and Gynecology

## 2016-11-05 ENCOUNTER — Inpatient Hospital Stay (HOSPITAL_COMMUNITY)
Admission: AD | Admit: 2016-11-05 | Discharge: 2016-11-05 | Disposition: A | Discharge status: Home / Self Care | Payer: Self-pay | Source: Ambulatory Visit | Attending: Obstetrics and Gynecology | Admitting: Obstetrics and Gynecology

## 2016-11-05 DIAGNOSIS — Z3493 Encounter for supervision of normal pregnancy, unspecified, third trimester: Secondary | ICD-10-CM | POA: Diagnosis present

## 2016-11-05 DIAGNOSIS — Z8249 Family history of ischemic heart disease and other diseases of the circulatory system: Secondary | ICD-10-CM

## 2016-11-05 DIAGNOSIS — Z3A38 38 weeks gestation of pregnancy: Secondary | ICD-10-CM | POA: Diagnosis not present

## 2016-11-05 DIAGNOSIS — O99820 Streptococcus B carrier state complicating pregnancy: Secondary | ICD-10-CM | POA: Diagnosis not present

## 2016-11-05 DIAGNOSIS — Z87891 Personal history of nicotine dependence: Secondary | ICD-10-CM

## 2016-11-05 DIAGNOSIS — O99824 Streptococcus B carrier state complicating childbirth: Secondary | ICD-10-CM | POA: Diagnosis present

## 2016-11-05 DIAGNOSIS — Z3490 Encounter for supervision of normal pregnancy, unspecified, unspecified trimester: Secondary | ICD-10-CM

## 2016-11-05 HISTORY — DX: Excessive and frequent menstruation with regular cycle: N92.0

## 2016-11-05 HISTORY — DX: Encounter for supervision of normal pregnancy, unspecified, unspecified trimester: Z34.90

## 2016-11-05 LAB — CBC
HEMATOCRIT: 35.7 % — AB (ref 36.0–46.0)
HEMOGLOBIN: 11.9 g/dL — AB (ref 12.0–15.0)
MCH: 26 pg (ref 26.0–34.0)
MCHC: 33.3 g/dL (ref 30.0–36.0)
MCV: 77.9 fL — AB (ref 78.0–100.0)
PLATELETS: 164 10*3/uL (ref 150–400)
RBC: 4.58 MIL/uL (ref 3.87–5.11)
RDW: 13.7 % (ref 11.5–15.5)
WBC: 10.3 10*3/uL (ref 4.0–10.5)

## 2016-11-05 LAB — TYPE AND SCREEN
ABO/RH(D): B POS
Antibody Screen: NEGATIVE

## 2016-11-05 LAB — ABO/RH: ABO/RH(D): B POS

## 2016-11-05 MED ORDER — WITCH HAZEL-GLYCERIN EX PADS: 1.0000 "application " | MEDICATED_PAD | CUTANEOUS | Status: DC | PRN

## 2016-11-05 MED ORDER — LACTATED RINGERS IV SOLN: 500.0000 mL | INTRAVENOUS | Status: DC | PRN

## 2016-11-05 MED ORDER — OXYTOCIN 40 UNITS IN LACTATED RINGERS INFUSION - SIMPLE MED: 2.5000 [IU]/h | INTRAVENOUS | Status: DC

## 2016-11-05 MED ORDER — EPHEDRINE 5 MG/ML INJ
10.0000 mg | INTRAVENOUS | Status: DC | PRN
  Filled 2016-11-05: qty 4

## 2016-11-05 MED ORDER — ZOLPIDEM TARTRATE 5 MG PO TABS: 5.0000 mg | ORAL_TABLET | Freq: Every evening | ORAL | Status: DC | PRN

## 2016-11-05 MED ORDER — SOD CITRATE-CITRIC ACID 500-334 MG/5ML PO SOLN: 30.0000 mL | ORAL | Status: DC | PRN

## 2016-11-05 MED ORDER — ONDANSETRON HCL 4 MG PO TABS: 4.0000 mg | ORAL_TABLET | ORAL | Status: DC | PRN

## 2016-11-05 MED ORDER — FENTANYL 2.5 MCG/ML BUPIVACAINE 1/10 % EPIDURAL INFUSION (WH - ANES): 14.0000 mL/h | INTRAMUSCULAR | Status: DC | PRN

## 2016-11-05 MED ORDER — FLEET ENEMA 7-19 GM/118ML RE ENEM: 1.0000 | ENEMA | RECTAL | Status: DC | PRN

## 2016-11-05 MED ORDER — ONDANSETRON HCL 4 MG/2ML IJ SOLN: 4.0000 mg | INTRAMUSCULAR | Status: DC | PRN

## 2016-11-05 MED ORDER — OXYCODONE-ACETAMINOPHEN 5-325 MG PO TABS: 2.0000 | ORAL_TABLET | ORAL | Status: DC | PRN

## 2016-11-05 MED ORDER — PHENYLEPHRINE 40 MCG/ML (10ML) SYRINGE FOR IV PUSH (FOR BLOOD PRESSURE SUPPORT)
80.0000 ug | PREFILLED_SYRINGE | INTRAVENOUS | Status: DC | PRN
  Filled 2016-11-05: qty 5

## 2016-11-05 MED ORDER — PRENATAL MULTIVITAMIN CH
1.0000 | ORAL_TABLET | Freq: Every day | ORAL | Status: DC
  Administered 2016-11-06 – 2016-11-07 (×2): 1 via ORAL
  Filled 2016-11-05 (×2): qty 1

## 2016-11-05 MED ORDER — ONDANSETRON HCL 4 MG/2ML IJ SOLN
4.0000 mg | Freq: Four times a day (QID) | INTRAMUSCULAR | Status: DC | PRN
  Administered 2016-11-05: 4 mg via INTRAVENOUS
  Filled 2016-11-05: qty 2

## 2016-11-05 MED ORDER — OXYCODONE-ACETAMINOPHEN 5-325 MG PO TABS: 1.0000 | ORAL_TABLET | ORAL | Status: DC | PRN

## 2016-11-05 MED ORDER — LACTATED RINGERS IV SOLN: 500.0000 mL | Freq: Once | INTRAVENOUS | Status: DC

## 2016-11-05 MED ORDER — ACETAMINOPHEN 325 MG PO TABS
650.0000 mg | ORAL_TABLET | ORAL | Status: DC | PRN
  Administered 2016-11-05: 650 mg via ORAL
  Filled 2016-11-05: qty 2

## 2016-11-05 MED ORDER — BENZOCAINE-MENTHOL 20-0.5 % EX AERO: 1.0000 "application " | INHALATION_SPRAY | CUTANEOUS | Status: DC | PRN

## 2016-11-05 MED ORDER — ACETAMINOPHEN 325 MG PO TABS: 650.0000 mg | ORAL_TABLET | ORAL | Status: DC | PRN

## 2016-11-05 MED ORDER — COCONUT OIL OIL: 1.0000 "application " | TOPICAL_OIL | Status: DC | PRN

## 2016-11-05 MED ORDER — SENNOSIDES-DOCUSATE SODIUM 8.6-50 MG PO TABS
2.0000 | ORAL_TABLET | ORAL | Status: DC
  Administered 2016-11-05 – 2016-11-06 (×2): 2 via ORAL
  Filled 2016-11-05 (×2): qty 2

## 2016-11-05 MED ORDER — OXYTOCIN 10 UNIT/ML IJ SOLN: 10.0000 [IU] | Freq: Once | INTRAMUSCULAR | Status: AC

## 2016-11-05 MED ORDER — SIMETHICONE 80 MG PO CHEW: 80.0000 mg | CHEWABLE_TABLET | ORAL | Status: DC | PRN

## 2016-11-05 MED ORDER — LACTATED RINGERS IV SOLN: INTRAVENOUS | Status: DC

## 2016-11-05 MED ORDER — OXYTOCIN 10 UNIT/ML IJ SOLN
INTRAMUSCULAR | Status: AC
  Administered 2016-11-05: 10 [IU]
  Filled 2016-11-05: qty 1

## 2016-11-05 MED ORDER — IBUPROFEN 600 MG PO TABS
600.0000 mg | ORAL_TABLET | Freq: Four times a day (QID) | ORAL | Status: DC
  Administered 2016-11-05 – 2016-11-07 (×8): 600 mg via ORAL
  Filled 2016-11-05 (×8): qty 1

## 2016-11-05 MED ORDER — DIBUCAINE 1 % RE OINT: 1.0000 "application " | TOPICAL_OINTMENT | RECTAL | Status: DC | PRN

## 2016-11-05 MED ORDER — FENTANYL CITRATE (PF) 100 MCG/2ML IJ SOLN: 100.0000 ug | INTRAMUSCULAR | Status: DC | PRN

## 2016-11-05 MED ORDER — OXYTOCIN BOLUS FROM INFUSION: 500.0000 mL | Freq: Once | INTRAVENOUS | Status: DC

## 2016-11-05 MED ORDER — LIDOCAINE HCL (PF) 1 % IJ SOLN
30.0000 mL | INTRAMUSCULAR | Status: DC | PRN
  Filled 2016-11-05: qty 30

## 2016-11-05 MED ORDER — DIPHENHYDRAMINE HCL 25 MG PO CAPS
25.0000 mg | ORAL_CAPSULE | Freq: Four times a day (QID) | ORAL | Status: DC | PRN
Start: 2016-11-05 — End: 2016-11-07

## 2016-11-05 MED ORDER — DIPHENHYDRAMINE HCL 50 MG/ML IJ SOLN: 12.5000 mg | INTRAMUSCULAR | Status: DC | PRN

## 2016-11-05 MED ORDER — PENICILLIN G POT IN DEXTROSE 60000 UNIT/ML IV SOLN
3.0000 10*6.[IU] | INTRAVENOUS | Status: DC
  Filled 2016-11-05 (×3): qty 50

## 2016-11-05 MED ORDER — TETANUS-DIPHTH-ACELL PERTUSSIS 5-2.5-18.5 LF-MCG/0.5 IM SUSP
0.5000 mL | Freq: Once | INTRAMUSCULAR | Status: DC
  Filled 2016-11-05: qty 0.5

## 2016-11-05 MED ORDER — PENICILLIN G POTASSIUM 5000000 UNITS IJ SOLR
5.0000 10*6.[IU] | Freq: Once | INTRAVENOUS | Status: AC
  Administered 2016-11-05: 5 10*6.[IU] via INTRAVENOUS
  Filled 2016-11-05: qty 5

## 2016-11-05 NOTE — Lactation Note (Signed)
This note was copied from a baby's chart. Lactation Consultation Note  Patient Name: April Merritt ZOXWR'UToday's Date: 11/05/2016 Reason for consult: Initial assessment   Initial consult at 1 hr old.  GA 38.4; Mom is a P2 "tried" with first child who was a LPTI but difficulty latching. Inf[facility] dad[facility]  Mom stated she thought infant was still hungry.  Offered assistance with latching and mom consented. Taught hand expression with return demonstration and observation of colostrum easily flowing.  Mom has large, soft breast with everted nipples.   Put infant cross-cradle on left nipple STS; infant took a few sucks but kept crying so LC switched infant to right breast football hold.   Mom easily hand expressed colostrum and infant took breast on right side.  Rhythmical sucking; lots swallows heard.   Basic breastfeeding teaching done.  Educated on feeding cues, cluster feeding, and size of infant's stomach.  Encouraged to feed with cues 8-12 times per day.   Lactation brochure given and informed of hospital support group and OP services. Encouraged to call for assistance with latching as needed.    Maternal Data Has patient been taught Hand Expression?: Yes (milk easily expressed and flowing) Does the patient have breastfeeding experience prior to this delivery?: Yes  Feeding Feeding Type: Breast Fed  LATCH Score/Interventions Latch: Grasps breast easily, tongue down, lips flanged, rhythmical sucking.  Audible Swallowing: Spontaneous and intermittent  Type of Nipple: Everted at rest and after stimulation  Comfort (Breast/Nipple): Soft / non-tender     Hold (Positioning): Assistance needed to correctly position infant at breast and maintain latch. Intervention(s): Position options;Skin to skin;Support Pillows;Breastfeeding basics reviewed  LATCH Score: 9  Lactation Tools Discussed/Used     Consult Status Consult Status: Follow-up Date:  11/06/16 Follow-up type: In-patient    Lendon KaVann, Bronwyn Belasco Walker 11/05/2016, 3:51 PM

## 2016-11-05 NOTE — MAU Note (Signed)
Pt reports contractions  Since this morning. Very uncomfortable wiotha lot of pelvic pressure. Denies any vag bleeding or leaking. Pt to have water birth.

## 2016-11-05 NOTE — H&P (Signed)
LABOR AND DELIVERY ADMISSION HISTORY AND PHYSICAL NOTE  April Merritt is a 26 y.o. female G65P0101 with IUP at 82w4dby UKoreapresenting for SOL and was 4-5cm dilated with a bulging bag.  She requests a water birth.  She denies any HA, changes in vision, CP, SOB, NVD or LE edema. She reports positive fetal movement. She denies leakage of fluid or vaginal bleeding.  Prenatal History/Complications:  Past Medical History: Past Medical History:  Diagnosis Date  . Anxiety   . Depression   . H/O varicella   . Headache(784.0)    hx of migraines  . Mastitis 10/01/11  . Multiple food allergies     Past Surgical History: Past Surgical History:  Procedure Laterality Date  . WISDOM TOOTH EXTRACTION  2011    Obstetrical History: OB History    Gravida Para Term Preterm AB Living   2 1 0 1 0 1   SAB TAB Ectopic Multiple Live Births   0 0 0 0 1      Social History: Social History   Social History  . Marital status: Single    Spouse name: N/A  . Number of children: N/A  . Years of education: N/A   Social History Main Topics  . Smoking status: Former Smoker    Packs/day: 0.25    Types: Cigarettes    Quit date: 12/28/2015  . Smokeless tobacco: Never Used  . Alcohol use Yes  . Drug use: No  . Sexual activity: Yes    Partners: Male    Birth control/ protection: None   Other Topics Concern  . None   Social History Narrative  . None    Family History: Family History  Problem Relation Age of Onset  . Alcohol abuse Father   . Asthma Father   . Heart disease Father   . Heart attack Father   . Cancer Maternal Grandfather   . Cancer Maternal Aunt   . Anesthesia problems Neg Hx     Allergies: No Known Allergies  Prescriptions Prior to Admission  Medication Sig Dispense Refill Last Dose  . ondansetron (ZOFRAN ODT) 4 MG disintegrating tablet Take 1 tablet (4 mg total) by mouth every 8 (eight) hours as needed for nausea or vomiting. 15 tablet 0   . ondansetron (ZOFRAN ODT) 4  MG disintegrating tablet Take 1 tablet (4 mg total) by mouth every 8 (eight) hours as needed for nausea or vomiting. 20 tablet 4   . polyethylene glycol (MIRALAX / GLYCOLAX) packet Take 17 g by mouth daily. (Patient not taking: Reported on 06/02/2016) 14 each 0 Not Taking at Unknown time  . Prenatal Vit-Fe Fumarate-FA (PRENATAL MULTIVITAMIN) TABS tablet Take 1 tablet by mouth daily at 12 noon.   06/02/2016 at Unknown time  . promethazine (PHENERGAN) 12.5 MG tablet Take 1 tablet (12.5 mg total) by mouth every 6 (six) hours as needed for nausea or vomiting. 30 tablet 0      Review of Systems   All systems reviewed and negative except as stated in HPI  Blood pressure 129/73, pulse 87, temperature 98.2 F (36.8 C), resp. rate 22, last menstrual period 01/14/2016. General appearance: alert and cooperative Lungs: clear to auscultation bilaterally Heart: regular rate and rhythm Abdomen: soft, non-tender; bowel sounds normal Extremities: No calf swelling or tenderness Presentation: cephalic Fetal monitoring:  HR baseline 155, mod variability, pos accels, no decels, contractions q2-35m.  Uterine activity:  Dilation: 4.5 Effacement (%): 90 Station: -1, -2 Exam by:: R.W.[email or web address],IF  Prenatal labs: ABO, Rh: B/Positive/-- (07/27 0000) Antibody: Negative (07/27 0000) Rubella: Immune RPR: Nonreactive (07/27 0000)  HBsAg: Negative (07/27 0000)  HIV: Non-reactive (07/27 0000)  GBS: Positive (01/12 0000)  1 hr Glucola: 122 Genetic screening:   Anatomy US:  normal  Prenatal Transfer Tool  Maternal Diabetes: No Genetic Screening: Normal Maternal Ultrasounds/Referrals: Normal Fetal Ultrasounds or other Referrals:  None Maternal Substance Abuse:  No Significant Maternal Medications:  None Significant Maternal Lab Results: None  No results found for this or any previous visit (from the past 24 hour(s)).  Patient Active Problem List   Diagnosis Date Noted  . Pregnancy 11/05/2016  . Abdominal  pain 02/01/2012  . Menorrhagia 02/01/2012  . Overweight(278.02) 02/01/2012  . Headache(784.0)   . SVD (spontaneous vaginal delivery) 09/11/2011    Assessment: April Merritt is a 26 y.o. G2P0101 at 9w4dhere for SOL requesting water birth.  She denies any leakage of fluids or vaginal bleeding.   #Labor: SOL, desires water birth and  #Pain: Per request #FWB: Cat 1 #ID: GBS pos (PCN) #MOF: breast #MOC:  undecided #Circ: NA  DEloise Levels MD PGY-1 11/05/2016, 1:08 PM  The patient was seen and examined by me also Agree with note NST reactive and reassuring UCs as listed Cervical exams as listed in note Doula on way with tub. Pt states has been to class but was not told she needed to show anyone the certificate and did not sign any consent forms. Never met with a CNM for consultation.  Will try to sign consents now.  Family will try to obtain class certificate  MSeabron Spates CNM

## 2016-11-06 LAB — RPR: RPR Ser Ql: NONREACTIVE

## 2016-11-06 NOTE — Plan of Care (Signed)
Problem: Nutritional: Goal: Mothers verbalization of comfort with breastfeeding process will improve Outcome: Completed/Met Date Met: 11/06/16 Encouraged patient to call when breast feeding in order for RN to assess latch and/or assist.

## 2016-11-06 NOTE — Progress Notes (Signed)
UR chart review completed.  

## 2016-11-06 NOTE — Clinical Social Work Maternal (Signed)
CLINICAL SOCIAL WORK MATERNAL/CHILD NOTE  Patient Details  Name: April Merritt MRN: [id number] Date of Birth: 12/23/1990  Date:  11/06/2016  Clinical Social Worker Initiating Note:  Laurey Arrow Date/ Time Initiated:  11/06/16/0945     Child's Name:  April Merritt   Legal Guardian:  Mother (FOB is April Merritt 01/26/1991)   Need for Interpreter:  None   Date of Referral:  11/06/16     Reason for Referral:  Current Substance Use/Substance Use During Pregnancy  (hx of marijuana use. )   Referral Source:  Central Nursery   Address:  [address] [place] [address]  Phone number:  [phone]   Household Members:  Self, Significant Other   Natural Supports (not living in the home):   (MOB's Grandmother. )   Professional Supports: None   Employment: Part-time   Type of Work: Scientist, water quality at [employer].   Education:  Chiropractor Resources:  Medicaid   Other Resources:      Cultural/Religious Considerations Which May Impact Care:  Per MOB's Face Sheet, MOB is Christian  Strengths:  Home prepared for child , Ability to meet basic needs , Understanding of illness   Risk Factors/Current Problems:  Mental Health Concerns , Substance Use    Cognitive State:  Alert , Able to Concentrate , Linear Thinking , Insightful    Mood/Affect:  Bright , Happy , Interested , Comfortable    CSW Assessment: CSW met with MOB to complete an assessment for hx of THC use and MH hx.  When CSW arrived, MOB was relaxing in bed engaging in skin to skin with infant.  FOB was sitting on couch watching TV.  MOB gave CSW permission to meet with MOB while FOB was present.  CSW inquired about MOB's MH hx and MOB acknowledged a hx of depression and denied being prescribed medications.  MOB communicated that MOB's dx was awhile ago (timeline unknown) and overall MOB has felt good. CSW educated MOB about PPD. CSW informed MOB of possible  supports and interventions to decrease PPD.  CSW also encouraged MOB to seek medical attention if needed for increased signs and symptoms for PPD.  CSW offered MOB resources for outpatient therapy and MOB declined.   CSW also inquired about MOB's substance use.  MOB reported using marijuana prior to MOB's pregnancy confirmation.  MOB denied any use of any substance after pregnancy confirmation. CSW informed MOB of the hospital's drug screen policy, and informed MOB of the 2 screenings for the infant. MOB appeared understanding and communicated she was not concerned about the infant having a positive UDS or CDS. CSW shared with MOB that CSW will continue to monitor infant's UDS and CDS and will make a report to [place] if either are positive without an explanation.  MOB did not have any questions regarding the hospital's policy. CSW offered MOB resources and referrals for SA, and MOB declined.  MOB denied having CPS involvement, however, reported that custody of MOB's oldest daughter April Merritt) is with MOB's mother April Merritt [phone] (this information was confirmed by [place] worker, April Merritt). MOB stated that MOB and MOB's mother relationship has been rocky and MOB declined to share any specific information.   MOB and FOB reports being prepared to care for infant and during the assessment both parents responded approprietly to infant's needs. CSW reviewed safe sleep, and SIDS. MOB and FOB were knowledgeable and asked appropriate questions.  MOB communicated that she has a car seat and crib for infant.  MOB did not have any further questions, concerns, or needs at this time.  CSW thanked MOB for meeting with CSW and provided MOB with CSW contact information.   CSW Plan/Description:  Patient/Family Education , No Further Intervention Required/No Barriers to Discharge, Information/Referral to [employer]  (CSW will monitor infant's CDS and will make a report to  [place] if needed. )   Laurey Arrow, MSW, LCSW Clinical Social Work [phone]  Dimple Nanas, LCSW 11/06/2016, 2:26 PM

## 2016-11-06 NOTE — Lactation Note (Signed)
This note was copied from a baby's chart. Lactation Consultation Note  Patient Name: April Merritt ZOXWR'UToday's Date: 11/06/2016 Reason for consult: Follow-up assessment   Follow up with mom of 25 hour old infant. Infant with 7 BF, 3 attempts, 9 voids and 6 stools since birth. Infant weight 7 lb 9.9 oz with 1 % weigh loss since birth. LATCH scores 9.  Mom feels like BF is going well. Infant currently asleep in her crib. Mom reports some nipple tenderness with initial latch, Enc mom to apply EBM to nipples post BF. Mom declined need for [facility] assistance at this time. Mom without questions/concerns at this time. Enc mom to call out to desk for feeding assistance as needed.    Maternal Data    Feeding Feeding Type: Breast Fed Length of feed: 26 min  LATCH Score/Interventions Latch:  (mother did not call for latches; enc to call)                    Lactation Tools Discussed/Used     Consult Status Consult Status: Follow-up Date: 11/07/16 Follow-up type: In-patient    Silas FloodSharon S Cherylyn Sundby 11/06/2016, 2:59 PM

## 2016-11-06 NOTE — Progress Notes (Signed)
POSTPARTUM PROGRESS NOTE  Post Partum Day 1 Subjective:  April Merritt is a 26 y.o. [id number] 2882w4d s/p SVD.  No acute events overnight.  Pt denies problems with ambulating, voiding or po intake.  She denies nausea or vomiting.  Pain is well controlled.  She has had flatus. She has had bowel movement.  Lochia Minimal.   Objective: Blood pressure (!) 106/59, pulse 82, temperature 98.5 F (36.9 C), temperature source Oral, resp. rate 18, height 5\' 1"  (1.549 m), weight 84.4 kg (186 lb), last menstrual period 01/14/2016, SpO2 100 %, unknown if currently breastfeeding.  Physical Exam:  General: alert, cooperative and no distress Lochia:normal flow Chest: CTAB Heart: RRR no m/r/g Abdomen: +BS, soft, nontender,  Uterine Fundus: firm DVT Evaluation: No calf swelling or tenderness Extremities: no edema   Recent Labs  11/05/16 1140  HGB 11.9*  HCT 35.7*    Assessment/Plan:  ASSESSMENT: April Merritt is a 26 y.o. [id number] 6582w4d s/p SVD. Mom feels well and has no complaints and has minimal pain.  Baby will stay for 48 hrs due to GBS pos status.   Plan for discharge tomorrow   LOS: 1 day   Renne Muscaaniel L Warden, MD PGY-1 [facility], [facility]  11/06/2016, 9:14 AM   I examined pt and agree with documentation above and resident plan of care. Eino FarberWalidah Kennith GainN Karim, CNM

## 2016-11-07 DIAGNOSIS — Z3A38 38 weeks gestation of pregnancy: Secondary | ICD-10-CM

## 2016-11-07 DIAGNOSIS — O99820 Streptococcus B carrier state complicating pregnancy: Secondary | ICD-10-CM

## 2016-11-07 MED ORDER — IBUPROFEN 600 MG PO TABS: 600.0000 mg | ORAL_TABLET | Freq: Four times a day (QID) | ORAL | 0 refills | Status: DC

## 2016-11-07 MED ORDER — DOCUSATE SODIUM 100 MG PO CAPS: 100.0000 mg | ORAL_CAPSULE | Freq: Two times a day (BID) | ORAL | 0 refills | Status: DC

## 2016-11-07 NOTE — Discharge Summary (Signed)
OB Discharge Summary     Patient Name: April Merritt DOB: 1991-06-16 MRN: [id number]  Date of admission: 11/05/2016 Delivering MD: Aviva SignsWILLIAMS, MARIE L   Date of discharge: 11/07/2016  Admitting diagnosis: LABOR Intrauterine pregnancy: 8162w4d     Secondary diagnosis:  Active Problems:   Pregnancy   Postpartum care following vaginal delivery  Additional problems: None     Discharge diagnosis: Term Pregnancy Delivered                                                                                                Post partum procedures:None  Augmentation: None  Complications: None  Hospital course:  Onset of Labor With Vaginal Delivery     26 y.o. yo [id number] at 1162w4d was admitted in Active Labor on 11/05/2016. Patient had an uncomplicated labor course as follows:  Membrane Rupture Time/Date: 1:18 PM ,11/05/2016   Intrapartum Procedures: Episiotomy: None [1]                                         Lacerations:  None [1]  Patient had a delivery of a Viable infant. 11/05/2016  Information for the patient's newborn:  April Merritt, Girl Niko [[id number]][[id number]]  Delivery Method: Vaginal, Spontaneous Delivery (Filed from Delivery Summary)    Pateint had an uncomplicated postpartum course.  She is ambulating, tolerating a regular diet, passing flatus, and urinating well. Patient had a precipitous delivery, unable to receive sufficient GBS prophylaxis, stayed for 48 hours observation for baby without complications. Patient is discharged home in stable condition on 11/07/16.   Physical exam  Vitals:   11/05/16 2213 11/06/16 0610 11/06/16 1721 11/07/16 0600  BP: (!) 102/53 (!) 106/59 (!) 107/57 103/65  Pulse: 87 82 83 79  Resp: 20 18 18 18   Temp: 99.2 F (37.3 C) 98.5 F (36.9 C) 98.6 F (37 C) 98.1 F (36.7 C)  TempSrc: Oral Oral Oral Oral  SpO2:      Weight:      Height:       General: alert, cooperative and no distress Lochia: appropriate Uterine Fundus: firm Incision: N/A DVT  Evaluation: No evidence of DVT seen on physical exam. Negative Homan's sign. No cords or calf tenderness. Labs: Lab Results  Component Value Date   WBC 10.3 11/05/2016   HGB 11.9 (L) 11/05/2016   HCT 35.7 (L) 11/05/2016   MCV 77.9 (L) 11/05/2016   PLT 164 11/05/2016   CMP Latest Ref Rng & Units 04/21/2016  Glucose 65 - 99 mg/dL 75  BUN 6 - 20 mg/dL 7  Creatinine 4.030.44 - 4.741.00 mg/dL 2.590.57  Sodium 563135 - 875145 mmol/L 134(L)  Potassium 3.5 - 5.1 mmol/L 3.9  Chloride 101 - 111 mmol/L 102  CO2 22 - 32 mmol/L 23  Calcium 8.9 - 10.3 mg/dL 9.6  Total Protein 6.5 - 8.1 g/dL 7.5  Total Bilirubin 0.3 - 1.2 mg/dL 0.6  Alkaline Phos 38 - 126 U/L 37(L)  AST 15 - 41 U/L 20  ALT 14 -  54 U/L 18    Discharge instruction: per After Visit Summary and "Baby and Me Booklet".  After visit meds:  Allergies as of 11/07/2016   No Known Allergies     Medication List    STOP taking these medications   ondansetron 4 MG disintegrating tablet Commonly known as:  ZOFRAN ODT     TAKE these medications   docusate sodium 100 MG capsule Commonly known as:  COLACE Take 1 capsule (100 mg total) by mouth 2 (two) times daily.   ibuprofen 600 MG tablet Commonly known as:  ADVIL,MOTRIN Take 1 tablet (600 mg total) by mouth every 6 (six) hours.   prenatal multivitamin Tabs tablet Take 1 tablet by mouth daily at 12 noon.       Diet: routine diet  Activity: Advance as tolerated. Pelvic rest for 6 weeks.   Outpatient follow up:6 weeks Follow up Appt:No future appointments. Follow up Visit:No Follow-up on file.  Postpartum contraception: Nexplanon and Vasectomy  Newborn Data: Live born female  Birth Weight: 7 lb 11.5 oz (3500 g) APGAR: 6, 9  Baby Feeding: Breast Disposition:home with mother   11/07/2016 Jen Mow, DO  OB Fellow

## 2016-11-07 NOTE — Lactation Note (Signed)
This note was copied from a baby's chart. Lactation Consultation Note  Baby 6944 hours old.  6.7% weight loss. Discussed setting an alarm during the night for feedings. Mom encouraged to feed baby 8-12 times/24 hours and with feeding cues.  Encouraged STS. Baby sleeping in mother's arms.  Suggest unwrapping baby to view feeding. Baby latched in football hold on L breast. Sucks and swallows observed. Encouraged compression to keep her active. Reviewed engorgement care and monitoring voids/stools.    Patient Name: Girl Rande LawmanJasmine Ripple ZOXWR'UToday's Date: 11/07/2016 Reason for consult: Follow-up assessment   Maternal Data    Feeding Feeding Type: Breast Fed Length of feed: 15 min  LATCH Score/Interventions Latch: Grasps breast easily, tongue down, lips flanged, rhythmical sucking.  Audible Swallowing: A few with stimulation Intervention(s): Skin to skin;Alternate breast massage  Type of Nipple: Everted at rest and after stimulation  Comfort (Breast/Nipple): Soft / non-tender     Hold (Positioning): No assistance needed to correctly position infant at breast.  LATCH Score: 9  Lactation Tools Discussed/Used     Consult Status Consult Status: Complete    Hardie PulleyBerkelhammer, Kyian Obst Boschen 11/07/2016, 10:18 AM

## 2016-11-07 NOTE — Discharge Instructions (Signed)

## 2016-11-08 ENCOUNTER — Encounter (HOSPITAL_COMMUNITY): Payer: Self-pay

## 2018-11-17 ENCOUNTER — Encounter: Payer: Self-pay | Admitting: Obstetrics and Gynecology

## 2022-01-21 ENCOUNTER — Other Ambulatory Visit: Payer: Self-pay | Admitting: Nurse Practitioner

## 2022-01-21 ENCOUNTER — Ambulatory Visit
Admission: RE | Admit: 2022-01-21 | Discharge: 2022-01-21 | Disposition: A | Discharge status: Home / Self Care | Payer: Medicaid Other | Source: Ambulatory Visit | Attending: Nurse Practitioner | Admitting: Nurse Practitioner

## 2022-01-21 DIAGNOSIS — W19XXXA Unspecified fall, initial encounter: Secondary | ICD-10-CM

## 2023-01-05 IMAGING — CR DG THORACOLUMBAR SPINE 2V
2 series · 2 of 2 positions shown · non-contrast
Comparison: June 06, 2015

CLINICAL DATA: Status post fall 3 days ago with right-sided rib and
back pain.

EXAM:
THORACOLUMBAR SPINE 1V

[w thoraco-lumbar junction ap]
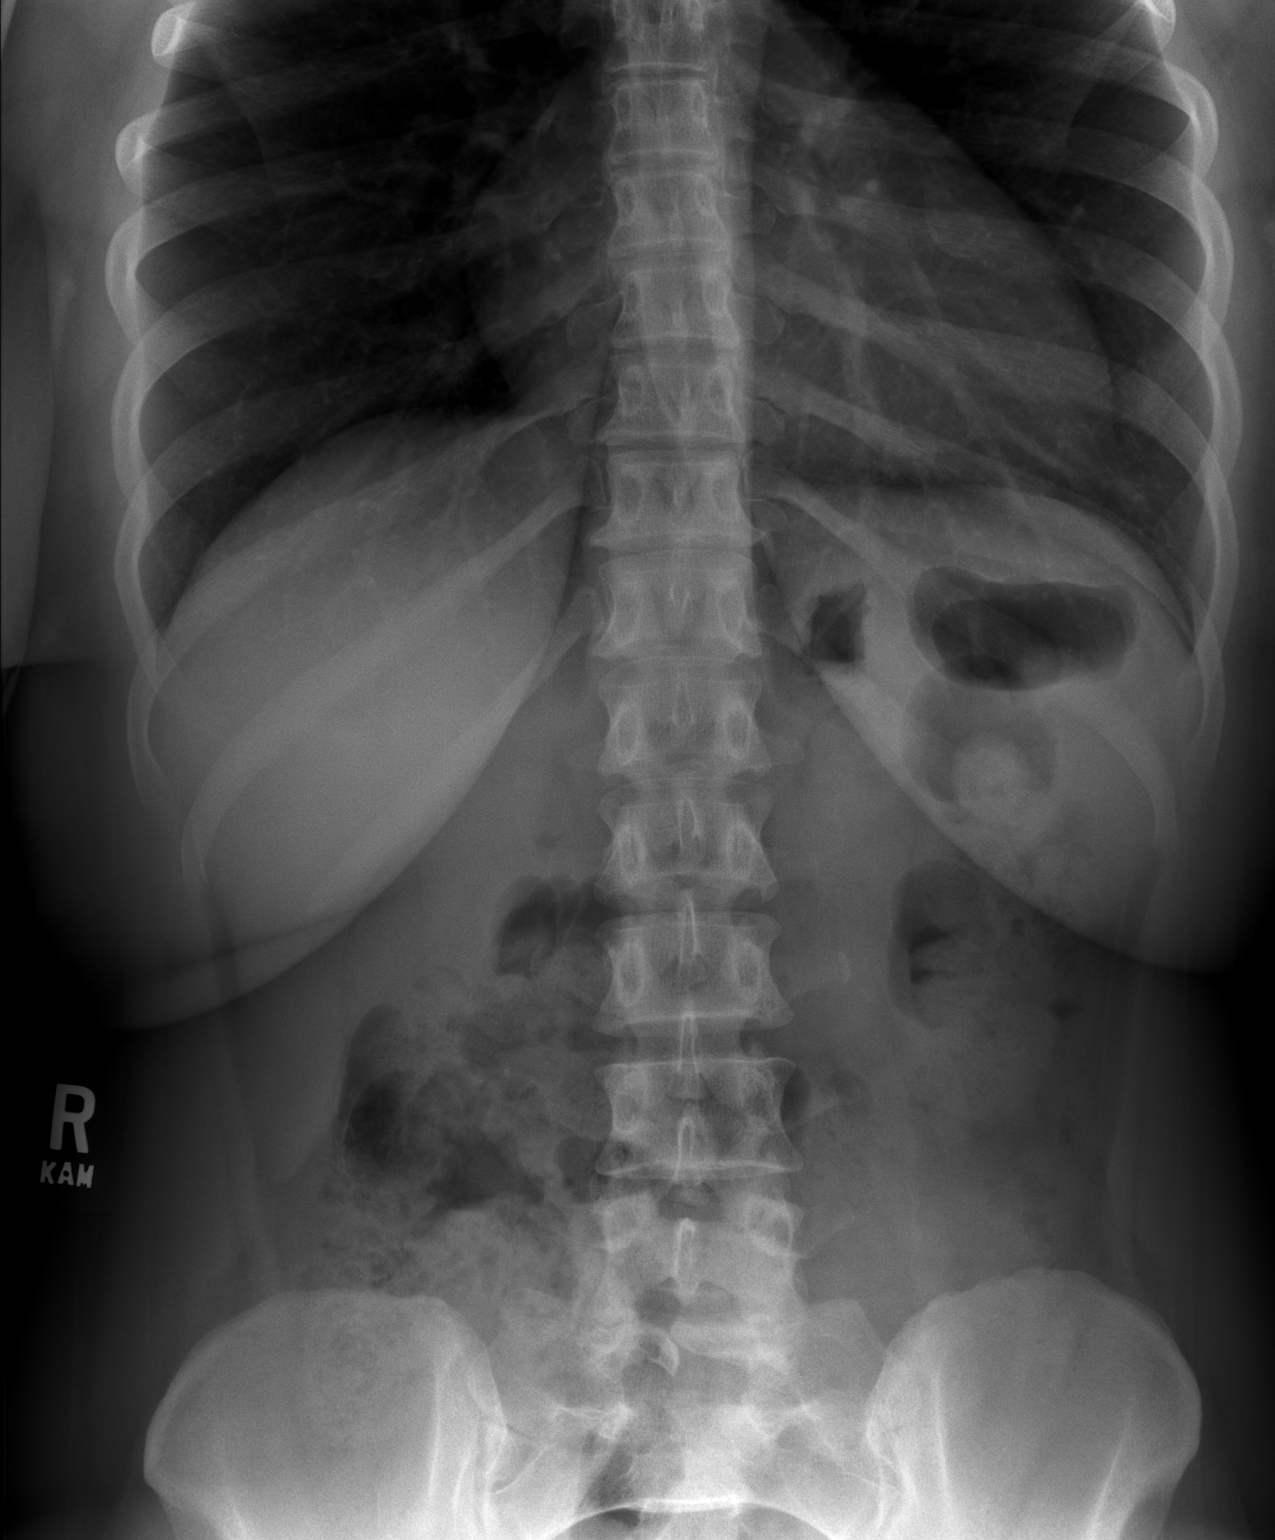

[w thoraco-lumbar junction lat]
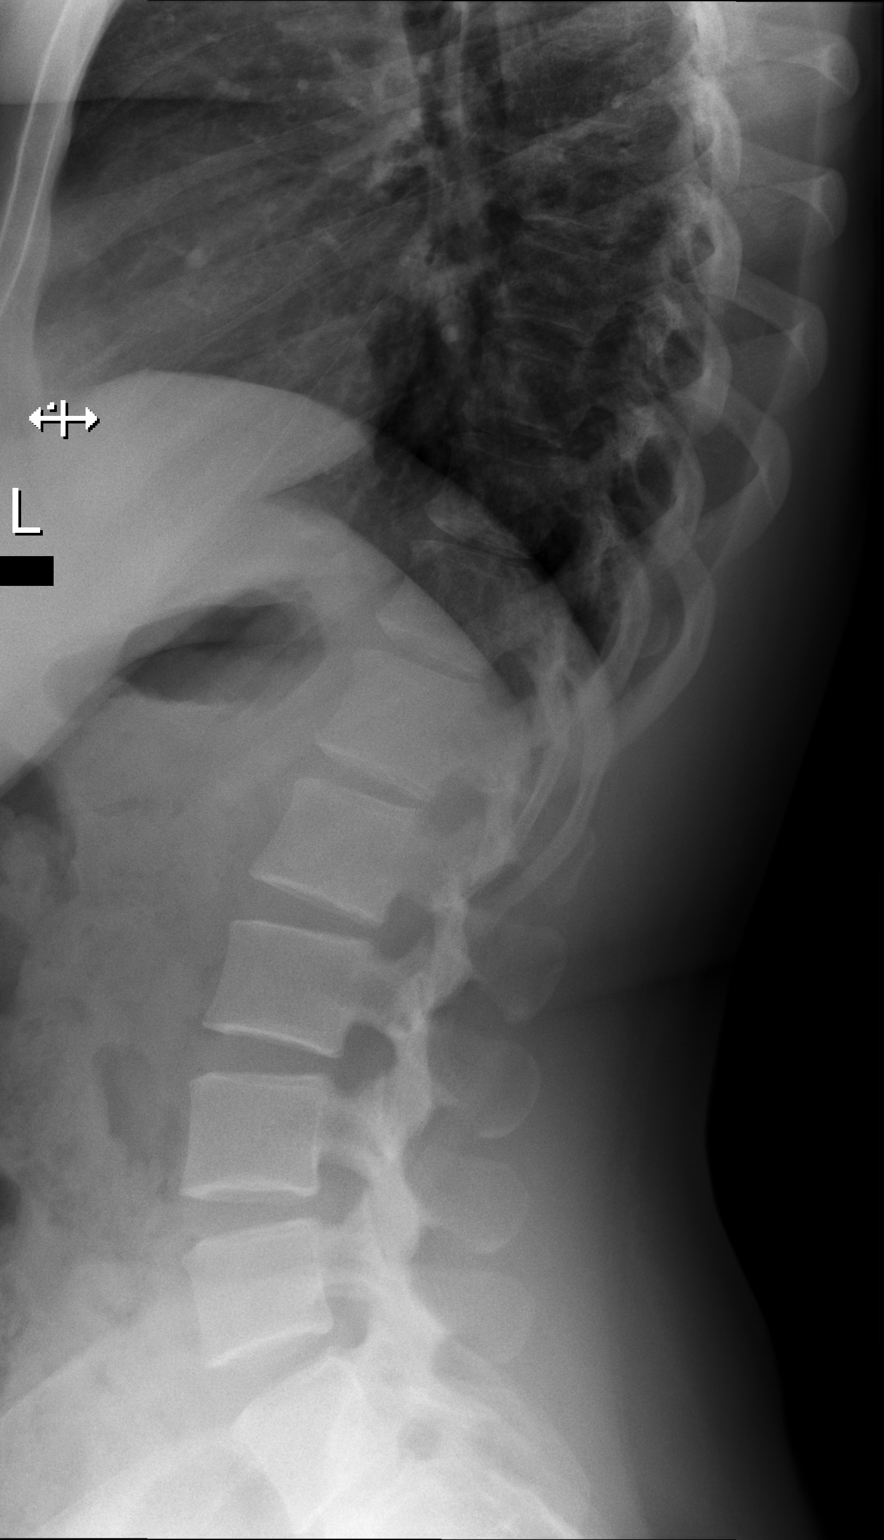

[2 of 2 positions shown; findings below may reference images not displayed]

FINDINGS: No acute fracture deformity is seen within the visualized portions
of the thoracic and lumbar spine. Congenital failure of closure of
the vertebral arch is seen at the level of S1. Alignment is normal.
No significant osseous abnormalities are identified.
IMPRESSION: 1. No acute osseous abnormality.
2. Additional findings consistent with spina bifida occulta.

## 2023-09-06 ENCOUNTER — Inpatient Hospital Stay (HOSPITAL_COMMUNITY)
Admission: AD | Admit: 2023-09-06 | Discharge: 2023-09-06 | Disposition: A | Discharge status: Home / Self Care | Payer: Medicaid Other | Attending: Obstetrics and Gynecology | Admitting: Obstetrics and Gynecology

## 2023-09-06 ENCOUNTER — Encounter (HOSPITAL_COMMUNITY): Payer: Self-pay | Admitting: Obstetrics and Gynecology

## 2023-09-06 ENCOUNTER — Other Ambulatory Visit: Payer: Self-pay

## 2023-09-06 DIAGNOSIS — O26891 Other specified pregnancy related conditions, first trimester: Secondary | ICD-10-CM | POA: Insufficient documentation

## 2023-09-06 DIAGNOSIS — Z3A11 11 weeks gestation of pregnancy: Secondary | ICD-10-CM | POA: Insufficient documentation

## 2023-09-06 DIAGNOSIS — B9689 Other specified bacterial agents as the cause of diseases classified elsewhere: Secondary | ICD-10-CM | POA: Diagnosis not present

## 2023-09-06 DIAGNOSIS — O23591 Infection of other part of genital tract in pregnancy, first trimester: Secondary | ICD-10-CM | POA: Diagnosis not present

## 2023-09-06 DIAGNOSIS — R109 Unspecified abdominal pain: Secondary | ICD-10-CM | POA: Insufficient documentation

## 2023-09-06 DIAGNOSIS — O26851 Spotting complicating pregnancy, first trimester: Secondary | ICD-10-CM | POA: Insufficient documentation

## 2023-09-06 DIAGNOSIS — N939 Abnormal uterine and vaginal bleeding, unspecified: Secondary | ICD-10-CM

## 2023-09-06 LAB — WET PREP, GENITAL
Sperm: NONE SEEN
Trich, Wet Prep: NONE SEEN
WBC, Wet Prep HPF POC: <10 (ref <10)
Yeast Wet Prep HPF POC: NONE SEEN

## 2023-09-06 LAB — URINALYSIS, ROUTINE W REFLEX MICROSCOPIC
Bilirubin Urine: NEGATIVE
Glucose, UA: NEGATIVE mg/dL
Hgb urine dipstick: NEGATIVE
Ketones, ur: NEGATIVE mg/dL
Leukocytes,Ua: NEGATIVE
Nitrite: NEGATIVE
Protein, ur: NEGATIVE mg/dL
Specific Gravity, Urine: 1.016 (ref 1.005–1.030)
pH: 7 (ref 5.0–8.0)

## 2023-09-06 LAB — POCT PREGNANCY, URINE: Preg Test, Ur: POSITIVE — AB

## 2023-09-06 MED ORDER — METRONIDAZOLE 500 MG PO TABS: 500.0000 mg | ORAL_TABLET | Freq: Two times a day (BID) | ORAL | 0 refills | Status: AC

## 2023-09-06 NOTE — MAU Provider Note (Addendum)
Attestation of Supervision of Student:  I confirm that I have verified the information documented in the medical resident's note and that I have also personally  observed  the history, physical exam and all medical decision making activities.  I have verified that all services and findings are accurately documented in this student's note; and I agree with management and plan as outlined in the documentation. I have also made any necessary editorial changes.   Richardson Landry, CNM Center for [employer], [facility] 09/06/2023 1:43 PM   History     CSN: [id number]  Arrival date and time: 09/06/23 0856   None     Chief Complaint  Patient presents with   Vaginal Bleeding   Abdominal Pain   April Merritt is a 32 y.o. [id number] at [redacted]w[redacted]d who receives care at [facility].  She presents today for vaginal bleeding. States she noticed some blood on the toilet paper this morning and then a few drops of blood in the shower about 30 minutes later. Last intercourse was yesterday. Denies abnormal vaginal discharge, odor, pruritus, or irritation. States she has had lower abdominal cramping since September and has been taking Flexeril for this since last week per her OBGYN. Rates her cramping as a 5/10 when she arrived to the MAU, currently rates it as a 1/10. Had her first ultrasound 2 weeks ago and was told everything looked normal.  OB History     Gravida  3   Para  2   Term  1   Preterm  1   AB  0   Living  2      SAB  0   IAB  0   Ectopic  0   Multiple  0   Live Births  2           Past Medical History:  Diagnosis Date   Anxiety    Depression    H/O varicella    Headache(784.0)    hx of migraines   Mastitis 10/01/11   Menorrhagia 02/01/2012   Multiple food allergies     Past Surgical History:  Procedure Laterality Date   WISDOM TOOTH EXTRACTION  2011    Family History  Problem Relation Age of Onset   Alcohol abuse Father     Asthma Father    Heart disease Father    Heart attack Father    Cancer Maternal Grandfather    Cancer Maternal Aunt    Anesthesia problems Neg Hx     Social History   Tobacco Use   Smoking status: Former    Current packs/day: 0.00    Types: Cigarettes    Quit date: 12/28/2015    Years since quitting: 7.6   Smokeless tobacco: Never  Substance Use Topics   Alcohol use: Yes   Drug use: Yes    Types: Marijuana    Allergies: No Known Allergies  Medications Prior to Admission  Medication Sig Dispense Refill Last Dose   docusate sodium (COLACE) 100 MG capsule Take 1 capsule (100 mg total) by mouth 2 (two) times daily. 30 capsule 0    ibuprofen (ADVIL,MOTRIN) 600 MG tablet Take 1 tablet (600 mg total) by mouth every 6 (six) hours. 30 tablet 0    Prenatal Vit-Fe Fumarate-FA (PRENATAL MULTIVITAMIN) TABS tablet Take 1 tablet by mouth daily at 12 noon.       Review of Systems  Genitourinary:  Positive for pelvic pain and vaginal bleeding. Negative for dysuria  and vaginal discharge.   See HPI Physical Exam   Blood pressure 109/69, pulse 91, temperature 98.7 F (37.1 C), temperature source Oral, resp. rate 19, height 5\' 1"  (1.549 m), weight 67.3 kg, last menstrual period 06/16/2023, SpO2 100%, unknown if currently breastfeeding.  Physical Exam Constitutional:      General: She is not in acute distress.    Appearance: She is well-developed.  Pulmonary:     Effort: Pulmonary effort is normal.  Neurological:     Mental Status: She is alert.  Psychiatric:        Mood and Affect: Mood normal.        Behavior: Behavior normal.     Fetal Assessment N/A  MAU Course   Results for orders placed or performed during the hospital encounter of 09/06/23 (from the past 24 hour(s))  Pregnancy, urine POC     Status: Abnormal   Collection Time: 09/06/23  9:35 AM  Result Value Ref Range   Preg Test, Ur POSITIVE (A) NEGATIVE  Urinalysis, Routine w reflex microscopic -Urine, Clean Catch      Status: Abnormal   Collection Time: 09/06/23  9:38 AM  Result Value Ref Range   Color, Urine YELLOW YELLOW   APPearance CLOUDY (A) CLEAR   Specific Gravity, Urine 1.016 1.005 - 1.030   pH 7.0 5.0 - 8.0   Glucose, UA NEGATIVE NEGATIVE mg/dL   Hgb urine dipstick NEGATIVE NEGATIVE   Bilirubin Urine NEGATIVE NEGATIVE   Ketones, ur NEGATIVE NEGATIVE mg/dL   Protein, ur NEGATIVE NEGATIVE mg/dL   Nitrite NEGATIVE NEGATIVE   Leukocytes,Ua NEGATIVE NEGATIVE  Wet prep, genital     Status: Abnormal   Collection Time: 09/06/23 10:22 AM  Result Value Ref Range   Yeast Wet Prep HPF POC NONE SEEN NONE SEEN   Trich, Wet Prep NONE SEEN NONE SEEN   Clue Cells Wet Prep HPF POC PRESENT (A) NONE SEEN   WBC, Wet Prep HPF POC <10 <10   Sperm NONE SEEN    No results found.  MDM PE Labs: UA, Wet prep, GC/chlamydia  Assessment and Plan  32 G3P1102  SIUP at [redacted]w[redacted]d  Vaginal bleeding -minimal vaginal bleeding likely secondary to recent intercourse vs infectious process -Exam findings discussed -Wet prep shows BV infection, will treat with Metronidazole -Will call patient with GC/chlamydia results -Limited bedside US confirms single IUP with HR 130s   Lorayne Bender PGY-1, Family Medicine 09/06/2023, 11:50 AM   Lamont Snowball, MSN, CNM, RNC-OB Certified Nurse Midwife, [facility] 09/06/2023 1:43 PM

## 2023-09-06 NOTE — MAU Note (Signed)
April Merritt is a 32 y.o. at Unknown here in MAU reporting: she's having menstrual cramping and VB with wiping.  Reports last intercourse was yesterday. LMP: 06/16/2023 Onset of complaint: today Pain score: 5 Vitals:   09/06/23 0930  BP: 109/69  Pulse: 91  Resp: 19  Temp: 98.7 F (37.1 C)  SpO2: 100%     FHT:NA Lab orders placed from triage:   UPT

## 2023-09-07 LAB — GC/CHLAMYDIA PROBE AMP (~~LOC~~) NOT AT ARMC
Chlamydia: NEGATIVE
Comment: NEGATIVE
Comment: NORMAL
Neisseria Gonorrhea: NEGATIVE

## 2023-09-29 NOTE — L&D Delivery Note (Signed)
 OB/GYN Faculty Practice Delivery Note  April Merritt is a 33 y.o. [id number] s/p SVD at [redacted]w[redacted]d. She was admitted for SOL.   ROM: 2h 61m with clear fluid GBS Status: Positive, untreated due to precipitous labor course.   Maximum Maternal Temperature: None taken  Labor Progress: Initial SVE: complete @0546 .   Delivery Date/Time: 03/14/2024 @0552   Delivery: Called to room to stand by for patient's primary provider. Patient complete and involuntarily pushing, babe crowning. Head delivered LOA. No nuchal cord present. Shoulder and body delivered in usual fashion. Infant with spontaneous cry, placed on mother's abdomen, dried and stimulated. Cord clamping delayed per patient request.   Care relinquished to CNM Vivian @0600   Baby Weight: 3850g  Infant:  APGAR (1 MIN):  8 APGAR (5 MINS):  9  Darrow End, MD OB Family Medicine Fellow, [facility] for [facility], [facility] 03/14/2024, 6:00 AM

## 2023-09-29 NOTE — L&D Delivery Note (Signed)
 Delivery Note Addendum  Delivery Note Labor onset:  03/14/24 Labor Onset Time: 0330 per pt Complete dilation at 5:46 AM Analgesia/Anesthesia intrapartum: none  Arrived to bedside, newborn girl delivered by Dr. Alto Atta after a precipitous delivery of less than 30 min from admission to arrival in room. GBS pos with PCN allergy, but birth occurred too quickly to receive antibiotics. Infant was on mother's abd, attached to cord, and placenta in situ.   Cord double clamped after 8 min and cut by father.  RN x2 present for birth.  Cord blood sample collected: Yes Arterial cord blood sample collected: No  Placenta delivered Schultz side, intact, with 3 VC.  Placenta to L&D Yes. Uterine tone firm, bleeding none  No laceration identified.  Anesthesia: none Complications: none APGAR: APGAR (1 MIN): 8  APGAR (5 MINS): 9  APGAR (10 MINS):   Mom to postpartum.  Baby to Couplet care / Skin to Skin.  April Merritt B April Berzins DNP, CNM 03/14/2024, 7:43 AM

## 2023-11-01 ENCOUNTER — Ambulatory Visit
Admission: EM | Admit: 2023-11-01 | Discharge: 2023-11-01 | Disposition: A | Discharge status: Home / Self Care | Payer: Medicaid Other | Attending: Family Medicine | Admitting: Family Medicine

## 2023-11-01 DIAGNOSIS — J0140 Acute pansinusitis, unspecified: Secondary | ICD-10-CM

## 2023-11-01 MED ORDER — AZITHROMYCIN 250 MG PO TABS: ORAL_TABLET | ORAL | 0 refills | Status: DC

## 2023-11-01 NOTE — ED Triage Notes (Signed)
Pt presents to UC for c/o shortness of breath, headache, cough, ear pain, sore throat, mucus, chills x4 days. Took mucinex, allergy medicine Pt is [redacted] weeks pregnant.

## 2023-11-01 NOTE — Discharge Instructions (Signed)
Take medication as directed.  Hydrate well with fluids. Follow-up with primary care provider if symptoms have not resolved or improved within 7 days.

## 2023-11-01 NOTE — ED Provider Notes (Signed)
[facility]    CSN: [id number] Arrival date & time: 11/01/23  1003      History   Chief Complaint Chief Complaint  Patient presents with   Cough    HPI April Merritt is a 33 y.o. female.   HPI Patient presents today for evaluation of days of cough, sore throat, ear pain, chills.  Patient reports she is uncertain if she has had fever.  She endorses some increased work of breathing.  She has no history of asthma.  Patient is [redacted] weeks pregnant. She has been taking Mucinex and Benadryl for symptom management. Past Medical History:  Diagnosis Date   Anxiety    Depression    H/O varicella    Headache(784.0)    hx of migraines   Mastitis 10/01/11   Menorrhagia 02/01/2012   Multiple food allergies     Patient Active Problem List   Diagnosis Date Noted   Pregnancy 11/05/2016   Postpartum care following vaginal delivery 11/05/2016   Overweight 02/01/2012   Headache     Past Surgical History:  Procedure Laterality Date   WISDOM TOOTH EXTRACTION  2011    OB History     Gravida  3   Para  2   Term  1   Preterm  1   AB  0   Living  2      SAB  0   IAB  0   Ectopic  0   Multiple  0   Live Births  2            Home Medications    Prior to Admission medications   Medication Sig Start Date End Date Taking? Authorizing Provider  azithromycin (ZITHROMAX) 250 MG tablet Take 2 tabs PO x 1 dose, then 1 tab PO QD x 4 days 11/01/23  Yes Bing Neighbors, NP  Prenatal Vit-Fe Fumarate-FA (PRENATAL MULTIVITAMIN) TABS tablet Take 1 tablet by mouth daily at 12 noon.   Yes [provider]    Family History Family History  Problem Relation Age of Onset   Alcohol abuse Father    Asthma Father    Heart disease Father    Heart attack Father    Cancer Maternal Grandfather    Cancer Maternal Aunt    Anesthesia problems Neg Hx     Social History Social History   Tobacco Use   Smoking status: Former    Current packs/day: 0.00    Types:  Cigarettes    Quit date: 12/28/2015    Years since quitting: 7.8   Smokeless tobacco: Never  Substance Use Topics   Alcohol use: Yes   Drug use: Yes    Types: Marijuana     Allergies   Penicillin g   Review of Systems Review of Systems Pertinent negatives listed in HPI   Physical Exam Triage Vital Signs ED Triage Vitals  Encounter Vitals Group     BP      Systolic BP Percentile      Diastolic BP Percentile      Pulse      Resp      Temp      Temp src      SpO2      Weight      Height      Head Circumference      Peak Flow      Pain Score      Pain Loc      Pain Education  Exclude from Growth Chart    No data found.  Updated Vital Signs BP 112/77 (BP Location: Right Arm)   Pulse 86   Temp 98.2 F (36.8 C) (Oral)   Resp 18   LMP 06/16/2023   SpO2 99%   Visual Acuity Right Eye Distance:   Left Eye Distance:   Bilateral Distance:    Right Eye Near:   Left Eye Near:    Bilateral Near:     Physical Exam Vitals reviewed.  Constitutional:      General: She is not in acute distress.    Appearance: Normal appearance. She is not ill-appearing or toxic-appearing.  HENT:     Head: Normocephalic and atraumatic.     Right Ear: Tympanic membrane, ear canal and external ear normal. There is no impacted cerumen.     Left Ear: Tympanic membrane, ear canal and external ear normal. There is no impacted cerumen.     Nose: Congestion and rhinorrhea present.     Mouth/Throat:     Pharynx: No oropharyngeal exudate or posterior oropharyngeal erythema.  Eyes:     Extraocular Movements: Extraocular movements intact.     Pupils: Pupils are equal, round, and reactive to light.  Cardiovascular:     Rate and Rhythm: Normal rate and regular rhythm.  Pulmonary:     Effort: Pulmonary effort is normal.     Breath sounds: Normal breath sounds. No wheezing or rhonchi.  Musculoskeletal:     Cervical back: Normal range of motion and neck supple.  Skin:    General: Skin is  warm and dry.     Capillary Refill: Capillary refill takes less than 2 seconds.  Neurological:     General: No focal deficit present.     Mental Status: She is alert.      UC Treatments / Results  Labs (all labs ordered are listed, but only abnormal results are displayed) Labs Reviewed - No data to display  EKG   Radiology No results found.  Procedures Procedures (including critical care time)  Medications Ordered in UC Medications - No data to display  Initial Impression / Assessment and Plan / UC Course  I have reviewed the triage vital signs and the nursing notes.  Pertinent labs & imaging results that were available during my care of the patient were reviewed by me and considered in my medical decision making (see chart for details).    Patient with a history of recurrent sinus infections treated in the day for acute sinusitis although onset of symptoms could have possibly been viral therefore educated patient that Z-Pak may be ineffective if symptoms are related to a viral illness.  Patient advised to continue symptom management with pregnancy self over-the-counter medications.  A list of pregnancy safe medication was provided to the patient.  Patient verbalized understanding and agreement with plan. Final Clinical Impressions(s) / UC Diagnoses   Final diagnoses:  Acute non-recurrent pansinusitis     Discharge Instructions      Take medication as directed.  Hydrate well with fluids. Follow-up with primary care provider if symptoms have not resolved or improved within 7 days.       ED Prescriptions     Medication Sig Dispense Auth. Provider   azithromycin (ZITHROMAX) 250 MG tablet Take 2 tabs PO x 1 dose, then 1 tab PO QD x 4 days 6 tablet Bing Neighbors, NP      PDMP not reviewed this encounter.   Bing Neighbors, NP 11/01/23 1039

## 2024-03-14 ENCOUNTER — Inpatient Hospital Stay (HOSPITAL_COMMUNITY)
Admission: AD | Admit: 2024-03-14 | Discharge: 2024-03-15 | DRG: 807 | Disposition: A | Discharge status: Home / Self Care | Attending: Obstetrics and Gynecology | Admitting: Obstetrics and Gynecology

## 2024-03-14 ENCOUNTER — Other Ambulatory Visit: Payer: Self-pay

## 2024-03-14 ENCOUNTER — Encounter (HOSPITAL_COMMUNITY): Payer: Self-pay | Admitting: Obstetrics and Gynecology

## 2024-03-14 DIAGNOSIS — Z8249 Family history of ischemic heart disease and other diseases of the circulatory system: Secondary | ICD-10-CM

## 2024-03-14 DIAGNOSIS — R21 Rash and other nonspecific skin eruption: Secondary | ICD-10-CM | POA: Diagnosis not present

## 2024-03-14 DIAGNOSIS — O99824 Streptococcus B carrier state complicating childbirth: Secondary | ICD-10-CM | POA: Diagnosis present

## 2024-03-14 DIAGNOSIS — Z3A38 38 weeks gestation of pregnancy: Secondary | ICD-10-CM

## 2024-03-14 DIAGNOSIS — Z880 Allergy status to penicillin: Secondary | ICD-10-CM

## 2024-03-14 DIAGNOSIS — Z87891 Personal history of nicotine dependence: Secondary | ICD-10-CM | POA: Diagnosis not present

## 2024-03-14 DIAGNOSIS — O26893 Other specified pregnancy related conditions, third trimester: Secondary | ICD-10-CM | POA: Diagnosis present

## 2024-03-14 HISTORY — DX: Headache, unspecified: R51.9

## 2024-03-14 LAB — CBC
HCT: 39.6 % (ref 36.0–46.0)
Hemoglobin: 12.7 g/dL (ref 12.0–15.0)
MCH: 25.9 pg — ABNORMAL LOW (ref 26.0–34.0)
MCHC: 32.1 g/dL (ref 30.0–36.0)
MCV: 80.7 fL (ref 80.0–100.0)
Platelets: 166 10*3/uL (ref 150–400)
RBC: 4.91 MIL/uL (ref 3.87–5.11)
RDW: 14.6 % (ref 11.5–15.5)
WBC: 12.3 10*3/uL — ABNORMAL HIGH (ref 4.0–10.5)
nRBC: 0 % (ref 0.0–0.2)

## 2024-03-14 LAB — POCT FERN TEST: POCT Fern Test: POSITIVE

## 2024-03-14 LAB — TYPE AND SCREEN
ABO/RH(D): B POS
Antibody Screen: NEGATIVE

## 2024-03-14 LAB — RPR: RPR Ser Ql: NONREACTIVE

## 2024-03-14 MED ORDER — SENNOSIDES-DOCUSATE SODIUM 8.6-50 MG PO TABS
2.0000 | ORAL_TABLET | ORAL | Status: DC
  Filled 2024-03-14: qty 2

## 2024-03-14 MED ORDER — WITCH HAZEL-GLYCERIN EX PADS: 1.0000 | MEDICATED_PAD | CUTANEOUS | Status: DC | PRN

## 2024-03-14 MED ORDER — SIMETHICONE 80 MG PO CHEW
80.0000 mg | CHEWABLE_TABLET | ORAL | Status: DC | PRN
Start: 2024-03-14 — End: 2024-03-16

## 2024-03-14 MED ORDER — LACTATED RINGERS IV SOLN: 500.0000 mL | INTRAVENOUS | Status: DC | PRN

## 2024-03-14 MED ORDER — PRENATAL MULTIVITAMIN CH
1.0000 | ORAL_TABLET | Freq: Every day | ORAL | Status: DC
  Administered 2024-03-14 – 2024-03-15 (×2): 1 via ORAL
  Filled 2024-03-14 (×2): qty 1

## 2024-03-14 MED ORDER — ONDANSETRON HCL 4 MG PO TABS: 4.0000 mg | ORAL_TABLET | ORAL | Status: DC | PRN

## 2024-03-14 MED ORDER — CLINDAMYCIN PHOSPHATE 900 MG/50ML IV SOLN: 900.0000 mg | Freq: Once | INTRAVENOUS | Status: DC

## 2024-03-14 MED ORDER — OXYTOCIN-SODIUM CHLORIDE 30-0.9 UT/500ML-% IV SOLN: 2.5000 [IU]/h | INTRAVENOUS | Status: DC

## 2024-03-14 MED ORDER — ACETAMINOPHEN 325 MG PO TABS
650.0000 mg | ORAL_TABLET | ORAL | Status: DC | PRN
  Administered 2024-03-14: 650 mg via ORAL
  Filled 2024-03-14: qty 2

## 2024-03-14 MED ORDER — DIBUCAINE (PERIANAL) 1 % EX OINT: 1.0000 | TOPICAL_OINTMENT | CUTANEOUS | Status: DC | PRN

## 2024-03-14 MED ORDER — BENZOCAINE-MENTHOL 20-0.5 % EX AERO: 1.0000 | INHALATION_SPRAY | CUTANEOUS | Status: DC | PRN

## 2024-03-14 MED ORDER — FENTANYL CITRATE (PF) 100 MCG/2ML IJ SOLN: 50.0000 ug | INTRAMUSCULAR | Status: DC | PRN

## 2024-03-14 MED ORDER — IBUPROFEN 600 MG PO TABS
600.0000 mg | ORAL_TABLET | Freq: Four times a day (QID) | ORAL | Status: DC
  Administered 2024-03-14 – 2024-03-15 (×5): 600 mg via ORAL
  Filled 2024-03-14 (×5): qty 1

## 2024-03-14 MED ORDER — TETANUS-DIPHTH-ACELL PERTUSSIS 5-2.5-18.5 LF-MCG/0.5 IM SUSY: 0.5000 mL | PREFILLED_SYRINGE | Freq: Once | INTRAMUSCULAR | Status: DC

## 2024-03-14 MED ORDER — ACETAMINOPHEN 325 MG PO TABS
650.0000 mg | ORAL_TABLET | Freq: Four times a day (QID) | ORAL | Status: DC
  Administered 2024-03-14 – 2024-03-15 (×4): 650 mg via ORAL
  Filled 2024-03-14 (×5): qty 2

## 2024-03-14 MED ORDER — ONDANSETRON HCL 4 MG/2ML IJ SOLN: 4.0000 mg | Freq: Four times a day (QID) | INTRAMUSCULAR | Status: DC | PRN

## 2024-03-14 MED ORDER — OXYTOCIN BOLUS FROM INFUSION
333.0000 mL | Freq: Once | INTRAVENOUS | Status: AC
  Administered 2024-03-14: 333 mL via INTRAVENOUS

## 2024-03-14 MED ORDER — DIPHENHYDRAMINE HCL 25 MG PO CAPS
25.0000 mg | ORAL_CAPSULE | Freq: Four times a day (QID) | ORAL | Status: DC | PRN
  Administered 2024-03-15: 25 mg via ORAL
  Filled 2024-03-14: qty 1

## 2024-03-14 MED ORDER — COCONUT OIL OIL: 1.0000 | TOPICAL_OIL | Status: DC | PRN

## 2024-03-14 MED ORDER — OXYTOCIN-SODIUM CHLORIDE 30-0.9 UT/500ML-% IV SOLN
INTRAVENOUS | Status: AC
  Filled 2024-03-14: qty 500

## 2024-03-14 MED ORDER — LACTATED RINGERS IV SOLN: INTRAVENOUS | Status: DC

## 2024-03-14 MED ORDER — SOD CITRATE-CITRIC ACID 500-334 MG/5ML PO SOLN: 30.0000 mL | ORAL | Status: DC | PRN

## 2024-03-14 MED ORDER — ACETAMINOPHEN 325 MG PO TABS
650.0000 mg | ORAL_TABLET | Freq: Four times a day (QID) | ORAL | Status: DC
Start: 2024-03-14 — End: 2024-03-14

## 2024-03-14 MED ORDER — LIDOCAINE HCL (PF) 1 % IJ SOLN: 30.0000 mL | INTRAMUSCULAR | Status: DC | PRN

## 2024-03-14 MED ORDER — ONDANSETRON HCL 4 MG/2ML IJ SOLN: 4.0000 mg | INTRAMUSCULAR | Status: DC | PRN

## 2024-03-14 NOTE — MAU Note (Signed)
 April Merritt is a 33 y.o. at [redacted]w[redacted]d here in MAU reporting: SROM at 0330 - clear fluid. Ctx started shortly after that - less than 5 minutes apart. +FM

## 2024-03-14 NOTE — H&P (Signed)
 OB ADMISSION/ HISTORY & PHYSICAL:  Admission Date: 03/14/2024  5:21 AM  Admit Diagnosis: Normal labor  April Merritt is a 33 y.o. female [id number] [redacted]w[redacted]d presenting for loss of fluid. Endorses active FM, denies vaginal bleeding. Ctx began @ 0330  History of current pregnancy: [id number]   Patient entered care with [facility] at 9+3 wks.   EDC 03/26/24 by 9+1 wk U/S.   Anatomy scan:  complete w/ posterior placenta.   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 Vaccines: Tdap: 2024 prior to pregnancy Influenza: current   OB History  Gravida Para Term Preterm AB Living  3 2 1 1  0 2  SAB IAB Ectopic Multiple Live Births  0 0 0 0 2    # Outcome Date GA Lbr Len/2nd Weight Sex Type Anes PTL Lv  3 Current           2 Term 11/05/16 [redacted]w[redacted]d 03:35 / 00:07 3500 g F Vag-Spont None  LIV     Birth Comments: WNL  1 Preterm 09/11/11 [redacted]w[redacted]d 05:30 / 01:48 2764 g F Vag-Spont EPI  LIV     Birth Comments: no anomalies noted    Medical / Surgical History: Past medical history:  Past Medical History:  Diagnosis Date   Anxiety    Depression    H/O varicella    Headache(784.0)    hx of migraines   Mastitis 10/01/11   Menorrhagia 02/01/2012   Multiple food allergies     Past surgical history:  Past Surgical History:  Procedure Laterality Date   WISDOM TOOTH EXTRACTION  2011   Family History:  Family History  Problem Relation Age of Onset   Alcohol abuse Father    Asthma Father    Heart disease Father    Heart attack Father    Cancer Maternal Grandfather    Cancer Maternal Aunt     Anesthesia problems Neg Hx     Social History:  reports that she quit smoking about 8 years ago. Her smoking use included cigarettes. She has never used smokeless tobacco. She reports current alcohol use. She reports current drug use. Drug: Marijuana.  Allergies: Penicillin  g   Current Medications at time of admission:  Prior to Admission medications   Medication Sig Start Date End Date Taking? Authorizing Provider  azithromycin  (ZITHROMAX ) 250 MG tablet Take 2 tabs PO x 1 dose, then 1 tab PO QD x 4 days 11/01/23   Buena Carmine, NP  Prenatal Vit-Fe Fumarate-FA (PRENATAL MULTIVITAMIN) TABS tablet Take 1 tablet by mouth daily at 12 noon.    [provider]    Review of Systems: Constitutional: Negative   HENT: Negative   Eyes: Negative   Respiratory: Negative   Cardiovascular: Negative   Gastrointestinal: Negative  Genitourinary: pos for bloody show, pos for LOF   Musculoskeletal: Negative   Skin: Negative   Neurological: Negative   Endo/Heme/Allergies: Negative   Psychiatric/Behavioral: Negative    Physical Exam: VS: Blood pressure 117/65, pulse 89, last menstrual period 06/16/2023, SpO2 100%, unknown if currently breastfeeding.  AAO x3, no signs of distress Cardiovascular: RRR Respiratory: Unlabored GU/GI: Abdomen gravid, non-tender, non-distended, active FM, vertex  Extremities: 1+ edema, negative for pain, tenderness, and cords  Cervical exam:Dilation: 10 Effacement (%): 100 Station: 0 Exam by:: April Turner, RN FHR: baseline rate 150 / variability moderate / accelerations present / absent decelerations    Prenatal Transfer Tool  Maternal Diabetes: No Genetic Screening: Normal Maternal Ultrasounds/Referrals: Normal Fetal Ultrasounds or other Referrals:  None Maternal Substance Abuse:  No Significant Maternal Medications:  None Significant Maternal Lab Results: Group B Strep positive Number of Prenatal Visits:greater than 3 verified prenatal  visits Maternal Vaccinations:Flu Other Comments:  None    Assessment: 33 y.o. [id number] [redacted]w[redacted]d  Active stage of labor FHR category 1 GBS pos, PCN allergy, advanced dilation  Plan:  Admit to L&D Routine admission orders Epidural PRN Imminent delivery Dr Adelene Homer and Dr. Mills Alma notified of admission and plan of care  Anice Kerbs DNP, CNM 03/14/2024 7:00 AM

## 2024-03-15 LAB — CBC
HCT: 34.3 % — ABNORMAL LOW (ref 36.0–46.0)
Hemoglobin: 11.1 g/dL — ABNORMAL LOW (ref 12.0–15.0)
MCH: 26.2 pg (ref 26.0–34.0)
MCHC: 32.4 g/dL (ref 30.0–36.0)
MCV: 81.1 fL (ref 80.0–100.0)
Platelets: 146 10*3/uL — ABNORMAL LOW (ref 150–400)
RBC: 4.23 MIL/uL (ref 3.87–5.11)
RDW: 14.7 % (ref 11.5–15.5)
WBC: 14.1 10*3/uL — ABNORMAL HIGH (ref 4.0–10.5)
nRBC: 0 % (ref 0.0–0.2)

## 2024-03-15 LAB — BIRTH TISSUE RECOVERY COLLECTION (PLACENTA DONATION)

## 2024-03-15 MED ORDER — NORETHINDRONE 0.35 MG PO TABS: 1.0000 | ORAL_TABLET | Freq: Every day | ORAL | 11 refills | Status: AC

## 2024-03-15 MED ORDER — NYSTATIN-TRIAMCINOLONE 100000-0.1 UNIT/GM-% EX CREA
TOPICAL_CREAM | Freq: Two times a day (BID) | CUTANEOUS | Status: DC
  Filled 2024-03-15: qty 30
  Filled 2024-03-15 (×2): qty 15

## 2024-03-15 MED ORDER — HYDROXYZINE HCL 25 MG PO TABS
25.0000 mg | ORAL_TABLET | Freq: Four times a day (QID) | ORAL | Status: DC | PRN
  Administered 2024-03-15: 25 mg via ORAL
  Filled 2024-03-15: qty 1
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
## 2024-03-15 NOTE — Lactation Note (Signed)
 This note was copied from a baby's chart. Lactation Consultation Note  Patient Name: Girl Minahil Quinlivan ZOXWR'U Date: 03/15/2024 Age:33 hours Reason for consult: Initial assessment;Early term 37-38.6wks Per MOB, infant recently breastfeed for 15 minutes prior to [facility] entering the room, LC did not observe latch, infant had 3 voids and 3 stools since birth. MOB feels infant is latching well has no feeding concerns for LC at this time. MOB will continue to breastfeed infant by cues, on demand, 8+ times within 24 hours, skin to skin. MOB knows to call for latch assistance if needed. MOB was made aware of O/P services, breastfeeding support groups, community resources, and our phone # for post-discharge questions.    Maternal Data Has patient been taught Hand Expression?: Yes Does the patient have breastfeeding experience prior to this delivery?: Yes How long did the patient breastfeed?: Per MOB, she breastfeed 1st child for 6 weeks and 2nd child for 12 weeks.  Feeding Mother's Current Feeding Choice: Breast Milk  LATCH Score  LC did not observe latch at this time.                  Lactation Tools Discussed/Used    Interventions Interventions: Breast feeding basics reviewed;Skin to skin;Position options;Education;LC Services brochure;CDC milk storage guidelines;CDC Guidelines for Breast Pump Cleaning;Guidelines for Milk Supply and Pumping Schedule Handout  Discharge Pump: DEBP;Personal  Consult Status Consult Status: Follow-up Date: 03/16/24 Follow-up type: In-patient    Pecolia Bourbon 03/15/2024, 1:42 AM

## 2024-03-15 NOTE — Lactation Note (Signed)
 This note was copied from a baby's chart. Lactation Consultation Note  Patient Name: April Merritt ZOXWR'U Date: 03/15/2024 Age:33 hours, P3  Reason for consult: Follow-up assessment;MD order;Early term 37-38.6wks;Infant weight loss;Breastfeeding assistance The MBU nurse contacted the [facility] to see dyad due to the baby being sleepy.  LC offered to assist and informed the MD asked for LC to see a latch.  LC placed baby STS on the right breast and assisted to latch in the football position and the depth was achieved and sustained for 15 mins with multiple swallows which increased with the feeding. When baby released the nipple appeared well rounded. Per mom the baby wil be reweighed early evening and if ok may go home.  LC reviewed breast feeding D/C teaching and the [facility] resources.  Maternal Data Has patient been taught Hand Expression?: Yes Does the patient have breastfeeding experience prior to this delivery?: Yes  Feeding Mother's Current Feeding Choice: Breast Milk  LATCH Score Latch: Grasps breast easily, tongue down, lips flanged, rhythmical sucking.  Audible Swallowing: Spontaneous and intermittent  Type of Nipple: Everted at rest and after stimulation  Comfort (Breast/Nipple): Soft / non-tender  Hold (Positioning): Assistance needed to correctly position infant at breast and maintain latch.  LATCH Score: 9   Lactation Tools Discussed/Used  Noted needed and mom declined hand pump   Interventions Interventions: Breast feeding basics reviewed;Assisted with latch;Skin to skin;Breast massage;Hand express;Reverse pressure;Breast compression;Adjust position;Support pillows;Position options;Education;CDC milk storage guidelines;CDC Guidelines for Breast Pump Cleaning;LC Services brochure  Discharge Discharge Education: Engorgement and breast care;Warning signs for feeding baby Pump: DEBP;Personal  Consult Status Consult Status: Follow-up Date: 03/16/24 Follow-up type:  In-patient    April Merritt 03/15/2024, 2:25 PM

## 2024-03-15 NOTE — Progress Notes (Signed)
 Post Partum Day 1 Subjective: no complaints, up ad lib, voiding, tolerating PO, and RN informed me of a rash beneath breast and under arm and pt states it was there in pregnancy and hydroxyzine helped and would like some now.    Objective: Blood pressure 114/84, pulse 76, temperature 98.4 F (36.9 C), temperature source Oral, resp. rate 18, height 5' 1 (1.549 m), weight 92.5 kg, last menstrual period 06/16/2023, SpO2 99%, unknown if currently breastfeeding.  Physical Exam:  General: alert, cooperative, and no distress Lochia: appropriate Uterine Fundus: FF, NT Incision: n/a DVT Evaluation: no calf tenderness  Recent Labs    03/14/24 0601 03/15/24 0541  HGB 12.7 11.1*  HCT 39.6 34.3*    Assessment/Plan: Discharge home today if baby is discharged, otherwise tomorrow.  RN will let me know if baby is cleared for discharge. Plans to use pills for [facility] - micronor sent to pt's pharmacy Breast feeding Hydoxyzine and cream for rash   LOS: 1 day   Madelene Schanz, MD 03/15/2024, 1:33 PM

## 2024-03-15 NOTE — Discharge Summary (Signed)
 Postpartum Discharge Summary  Date of Service updated 03-15-24     Patient Name: April Merritt DOB: 29-Dec-1990 MRN: [id number]  Date of admission: 03/14/2024 Delivery date:03/14/2024 Delivering provider: LEVEQUE, ALYSSA.  Melodee Spruce, CNM delivered placenta. Date of discharge: 03/15/2024  Admitting diagnosis: Normal labor and delivery [O80] Intrauterine pregnancy: [redacted]w[redacted]d     Secondary diagnosis:  Principal Problem:   Normal labor and delivery  Additional problems: None    Discharge diagnosis: Term Pregnancy Delivered                                              Post partum procedures:None Augmentation: N/A Complications: None  Hospital course: Onset of Labor With Vaginal Delivery      33 y.o. yo [id number] at [redacted]w[redacted]d was admitted in Active Labor on 03/14/2024. Labor course was complicated by nothing.  Membrane Rupture Time/Date: 3:30 AM,03/14/2024  Delivery Method:Vaginal, Spontaneous Operative Delivery:N/A Episiotomy: None Lacerations:  None Patient had a postpartum course complicated by nothing.  She is ambulating, tolerating a regular diet, passing flatus, and urinating well. Patient is discharged home in stable condition on 03/15/24.  Newborn Data: Birth date:03/14/2024 Birth time:5:52 AM Gender:Female Living status:Living Apgars:8 ,9  Weight:3850 g  Magnesium  Sulfate received: No BMZ received: No  Immunizations administered: Immunization History  Administered Date(s) Administered   Tdap 09/12/2011, 01/25/2014    Physical exam  Vitals:   03/14/24 1315 03/14/24 1702 03/14/24 2130 03/15/24 0625  BP: 115/67 101/60 118/67 114/84  Pulse: 67 85 86 76  Resp: 18 16 16 18   Temp: 99.5 F (37.5 C) 98.6 F (37 C) 99.1 F (37.3 C) 98.4 F (36.9 C)  TempSrc: Oral Oral Oral Oral  SpO2: 98% 100%  99%  Weight:      Height:       General: alert, cooperative, and no distress Lochia: appropriate Uterine Fundus: FF, NT Incision: N/A DVT Evaluation: no calf  tenderness Labs: Lab Results  Component Value Date   WBC 14.1 (H) 03/15/2024   HGB 11.1 (L) 03/15/2024   HCT 34.3 (L) 03/15/2024   MCV 81.1 03/15/2024   PLT 146 (L) 03/15/2024      Latest Ref Rng & Units 04/21/2016   12:40 PM  CMP  Glucose 65 - 99 mg/dL 75   BUN 6 - 20 mg/dL 7   Creatinine 1.47 - 8.29 mg/dL 5.62   Sodium 130 - 865 mmol/L 134   Potassium 3.5 - 5.1 mmol/L 3.9   Chloride 101 - 111 mmol/L 102   CO2 22 - 32 mmol/L 23   Calcium 8.9 - 10.3 mg/dL 9.6   Total Protein 6.5 - 8.1 g/dL 7.5   Total Bilirubin 0.3 - 1.2 mg/dL 0.6   Alkaline Phos 38 - 126 U/L 37   AST 15 - 41 U/L 20   ALT 14 - 54 U/L 18    Edinburgh Score:    03/14/2024    5:09 PM  Edinburgh Postnatal Depression Scale Screening Tool  I have been able to laugh and see the funny side of things. 0  I have looked forward with enjoyment to things. 0  I have blamed myself unnecessarily when things went wrong. 0  I have been anxious or worried for no good reason. 0  I have felt scared or panicky for no good reason. 0  Things have been getting on top  of me. 0  I have been so unhappy that I have had difficulty sleeping. 0  I have felt sad or miserable. 0  I have been so unhappy that I have been crying. 0  The thought of harming myself has occurred to me. 0  Edinburgh Postnatal Depression Scale Total 0      After visit meds:  Allergies as of 03/15/2024       Reactions   Penicillin  G Itching        Medication List     STOP taking these medications    azithromycin  250 MG tablet Commonly known as: ZITHROMAX        TAKE these medications    norethindrone 0.35 MG tablet Commonly known as: MICRONOR Take 1 tablet (0.35 mg total) by mouth daily.   prenatal multivitamin Tabs tablet Take 1 tablet by mouth daily at 12 noon.         Discharge home in stable condition Infant Feeding: Breast Infant Disposition:home with mother Discharge instruction: per After Visit Summary and Postpartum  booklet. Activity: Advance as tolerated. Pelvic rest for 6 weeks.  Diet: routine diet Anticipated Birth Control: OCPs Postpartum Appointment:6 weeks Additional Postpartum F/U: none Future Appointments:No future appointments. Follow up Visit:  Follow-up Information     Renea Carrion, MD. Go on 04/25/2024.   Specialty: Obstetrics and Gynecology Why: For Postpartum follow-up on 04/25/24 at 11:15am Contact information: [address] [place] [address] [phone]                     03/15/2024 Madelene Schanz, MD

## 2024-03-23 ENCOUNTER — Telehealth (HOSPITAL_COMMUNITY): Payer: Self-pay | Admitting: *Deleted

## 2024-03-23 NOTE — Telephone Encounter (Signed)
 03/23/2024  Name: April Merritt MRN: [id number] DOB: January 21, 1991  Reason for Call:  Transition of Care Hospital Discharge Call  Contact Status: Patient Contact Status: Complete  Language assistant needed: Interpreter Mode: Interpreter Not Needed        Follow-Up Questions: Do You Have Any Concerns About Your Health As You Heal From Delivery?: Yes What Concerns Do You Have About Your Health?: Has had issues with clogged ducts on left breast.  Milk is flowing now, but left nipple has become sore.  She is pumping only on left side until nipple pain resolves.  Says she is applying drops of breastmilk and coconut oil to nipple to aid in healing.  Baby nurses fine at right breast.  She has already seen a Advertising copywriter at [facility] and has someone coming to her home soon.  She feels like she has the lactation support that she needs at this time. Do You Have Any Concerns About Your Infants Health?: No  Edinburgh Postnatal Depression Scale:  In the Past 7 Days:    PHQ2-9 Depression Scale:     Discharge Follow-up: Edinburgh score requires follow up?:  (says answers are the same as in the hospital when score was 0, endorses she is doing well emotionally) Patient was advised of the following resources:: Support Group, Breastfeeding Support Group (declines postpartum group information via email)  Post-discharge interventions: Reviewed Newborn Safe Sleep Practices  Mliss Sieve, RN 03/23/2024 10:41
